# Patient Record
Sex: Female | Born: 1990
Health system: Southern US, Community
[De-identification: ages and names within clinical notes are randomized; demographics above are authoritative.]

## PROBLEM LIST (undated history)

## (undated) ENCOUNTER — Inpatient Hospital Stay (HOSPITAL_COMMUNITY): Payer: Self-pay

## (undated) DIAGNOSIS — L509 Urticaria, unspecified: Secondary | ICD-10-CM

## (undated) DIAGNOSIS — J45909 Unspecified asthma, uncomplicated: Secondary | ICD-10-CM

## (undated) HISTORY — PX: TONSILLECTOMY: SUR1361

## (undated) HISTORY — PX: WISDOM TOOTH EXTRACTION: SHX21

## (undated) HISTORY — PX: NO PAST SURGERIES: SHX2092

## (undated) HISTORY — DX: Urticaria, unspecified: L50.9

## (undated) HISTORY — PX: OTHER SURGICAL HISTORY: SHX169

---

## 2013-01-03 ENCOUNTER — Inpatient Hospital Stay (HOSPITAL_COMMUNITY)
Admission: AD | Admit: 2013-01-03 | Discharge: 2013-01-03 | Disposition: A | Discharge status: Home / Self Care | Payer: Self-pay | Source: Ambulatory Visit | Attending: Obstetrics & Gynecology | Admitting: Obstetrics & Gynecology

## 2013-01-03 ENCOUNTER — Encounter (HOSPITAL_COMMUNITY): Payer: Self-pay

## 2013-01-03 ENCOUNTER — Inpatient Hospital Stay (HOSPITAL_COMMUNITY): Payer: Self-pay

## 2013-01-03 DIAGNOSIS — R1032 Left lower quadrant pain: Secondary | ICD-10-CM | POA: Insufficient documentation

## 2013-01-03 DIAGNOSIS — Z1389 Encounter for screening for other disorder: Secondary | ICD-10-CM

## 2013-01-03 DIAGNOSIS — Z3490 Encounter for supervision of normal pregnancy, unspecified, unspecified trimester: Secondary | ICD-10-CM

## 2013-01-03 DIAGNOSIS — O99891 Other specified diseases and conditions complicating pregnancy: Secondary | ICD-10-CM | POA: Insufficient documentation

## 2013-01-03 LAB — URINALYSIS, ROUTINE W REFLEX MICROSCOPIC
Leukocytes, UA: NEGATIVE
Nitrite: NEGATIVE
Protein, ur: NEGATIVE mg/dL
Specific Gravity, Urine: 1.025 (ref 1.005–1.030)
Urobilinogen, UA: 0.2 mg/dL (ref 0.0–1.0)

## 2013-01-03 LAB — POCT PREGNANCY, URINE: Preg Test, Ur: POSITIVE — AB

## 2013-01-03 LAB — HCG, QUANTITATIVE, PREGNANCY: hCG, Beta Chain, Quant, S: 8937 m[IU]/mL — ABNORMAL HIGH (ref <5)

## 2013-01-03 NOTE — MAU Provider Note (Signed)
Chief Complaint: Abdominal Pain   First Provider Initiated Contact with Patient 01/03/13 1611     SUBJECTIVE HPI: Diamond Bolton is a 22 y.o. G1P0 at [redacted]w[redacted]d by LMP who presents to maternity admissions reporting abdominal cramping, mostly in LLQ and pelvic pressure.  She denies vaginal bleeding, vaginal itching/burning, urinary symptoms, h/a, dizziness, n/v, or fever/chills.     Past Medical History  Diagnosis Date  . Medical history non-contributory    Past Surgical History  Procedure Laterality Date  . No past surgeries     History   Social History  . Marital Status: Single    Spouse Name: N/A    Number of Children: N/A  . Years of Education: N/A   Occupational History  . Not on file.   Social History Main Topics  . Smoking status: Current Every Day Smoker  . Smokeless tobacco: Not on file  . Alcohol Use: Not on file  . Drug Use: Not on file  . Sexually Active: Yes   Other Topics Concern  . Not on file   Social History Narrative  . No narrative on file   No current facility-administered medications on file prior to encounter.   No current outpatient prescriptions on file prior to encounter.   No Known Allergies  ROS: Pertinent items in HPI  OBJECTIVE Blood pressure 96/68, temperature 98.7 F (37.1 C), temperature source Oral, resp. rate 16, last menstrual period 12/02/2012. GENERAL: Well-developed, well-nourished female in no acute distress.  HEENT: Normocephalic HEART: normal rate RESP: normal effort ABDOMEN: Soft, non-tender EXTREMITIES: Nontender, no edema NEURO: Alert and oriented Pelvic exam: Cervix pink, visually closed, without lesion, scant white creamy discharge, vaginal walls and external genitalia normal Bimanual exam: Cervix 0/long/high, firm, anterior, neg CMT, uterus nontender, slightly enlarged, adnexa without tenderness, enlargement, or mass  LAB RESULTS Results for orders placed during the hospital encounter of 01/03/13 (from the past 24  hour(s))  URINALYSIS, ROUTINE W REFLEX MICROSCOPIC     Status: None   Collection Time    01/03/13  2:05 PM      Result Value Range   Color, Urine YELLOW  YELLOW   APPearance CLEAR  CLEAR   Specific Gravity, Urine 1.025  1.005 - 1.030   pH 6.0  5.0 - 8.0   Glucose, UA NEGATIVE  NEGATIVE mg/dL   Hgb urine dipstick NEGATIVE  NEGATIVE   Bilirubin Urine NEGATIVE  NEGATIVE   Ketones, ur NEGATIVE  NEGATIVE mg/dL   Protein, ur NEGATIVE  NEGATIVE mg/dL   Urobilinogen, UA 0.2  0.0 - 1.0 mg/dL   Nitrite NEGATIVE  NEGATIVE   Leukocytes, UA NEGATIVE  NEGATIVE  POCT PREGNANCY, URINE     Status: Abnormal   Collection Time    01/03/13  2:25 PM      Result Value Range   Preg Test, Ur POSITIVE (*) NEGATIVE  WET PREP, GENITAL     Status: Abnormal   Collection Time    01/03/13  4:30 PM      Result Value Range   Yeast Wet Prep HPF POC NONE SEEN  NONE SEEN   Trich, Wet Prep NONE SEEN  NONE SEEN   Clue Cells Wet Prep HPF POC FEW (*) NONE SEEN   WBC, Wet Prep HPF POC MODERATE (*) NONE SEEN  HCG, QUANTITATIVE, PREGNANCY     Status: Abnormal   Collection Time    01/03/13  4:35 PM      Result Value Range   hCG, Beta Chain, Quant, S  1610 (*) <5 mIU/mL    IMAGING US Ob Comp Less 14 Wks  01/03/2013   *RADIOLOGY REPORT*  Clinical Data: Abdominal pain in early pregnancy.  OBSTETRIC <14 WK [place] AND TRANSVAGINAL OB US  Technique:  Both transabdominal and transvaginal ultrasound examinations were performed for complete evaluation of the gestation as well as the maternal uterus, adnexal regions, and pelvic cul-de-sac.  Transvaginal technique was performed to assess early pregnancy.  Comparison:  None.  Intrauterine gestational sac:  Single Yolk sac: Yes Embryo: Yes Cardiac Activity: Yes Heart Rate: 116 bpm  CRL: 2.8  mm  6 w  0 d        [place] EDC: 08/28/2013  Maternal uterus/adnexae: There is no subchorionic hemorrhage.  The ovaries appear normal. There is a trace of free fluid in the pelvis.  IMPRESSION: Normal  appearing single intrauterine pregnancy of approximately 6 weeks and 0 days gestation.   Original Report Authenticated By: Francene Boyers, M.D.   US Ob Transvaginal  01/03/2013   *RADIOLOGY REPORT*  Clinical Data: Abdominal pain in early pregnancy.  OBSTETRIC <14 WK [place] AND TRANSVAGINAL OB US  Technique:  Both transabdominal and transvaginal ultrasound examinations were performed for complete evaluation of the gestation as well as the maternal uterus, adnexal regions, and pelvic cul-de-sac.  Transvaginal technique was performed to assess early pregnancy.  Comparison:  None.  Intrauterine gestational sac:  Single Yolk sac: Yes Embryo: Yes Cardiac Activity: Yes Heart Rate: 116 bpm  CRL: 2.8  mm  6 w  0 d        [place] EDC: 08/28/2013  Maternal uterus/adnexae: There is no subchorionic hemorrhage.  The ovaries appear normal. There is a trace of free fluid in the pelvis.  IMPRESSION: Normal appearing single intrauterine pregnancy of approximately 6 weeks and 0 days gestation.   Original Report Authenticated By: Francene Boyers, M.D.    ASSESSMENT 1. Normal IUP (intrauterine pregnancy) on prenatal ultrasound     PLAN Discharge home F/U with early prenatal care Pregnancy verification letter given Return to MAU as needed    Medication List    ASK your doctor about these medications       albuterol 108 (90 BASE) MCG/ACT inhaler  Commonly known as:  PROVENTIL HFA;VENTOLIN HFA  Inhale 2 puffs into the lungs every 6 (six) hours as needed for wheezing.         Sharen Counter Certified Nurse-Midwife 01/03/2013  4:17 PM

## 2013-01-03 NOTE — MAU Note (Signed)
Patient is in with c/o abdominal pain and late on her period this month. lmp 12/02/12. She denies vaginal bleeding or Abnormal  Discharge.

## 2013-01-03 NOTE — MAU Note (Signed)
Pt states she has not had a menses since before 12/02/2012. Pt states she has been having abdominal pain for 3 wks. Pt states she not taken a HPT

## 2013-01-04 LAB — GC/CHLAMYDIA PROBE AMP: GC Probe RNA: NEGATIVE

## 2013-01-10 NOTE — MAU Provider Note (Signed)
Attestation of Attending Supervision of Advanced Practitioner (CNM/NP): Evaluation and management procedures were performed by the Advanced Practitioner under my supervision and collaboration. I have reviewed the Advanced Practitioner's note and chart, and I agree with the management and plan.  Dade Rodin H. 3:17 PM   

## 2013-01-25 ENCOUNTER — Encounter (HOSPITAL_COMMUNITY): Payer: Self-pay

## 2013-01-25 ENCOUNTER — Inpatient Hospital Stay (HOSPITAL_COMMUNITY)
Admission: AD | Admit: 2013-01-25 | Discharge: 2013-01-25 | Disposition: A | Discharge status: Home / Self Care | Payer: 59 | Source: Ambulatory Visit | Attending: Family Medicine | Admitting: Family Medicine

## 2013-01-25 DIAGNOSIS — O210 Mild hyperemesis gravidarum: Secondary | ICD-10-CM | POA: Insufficient documentation

## 2013-01-25 DIAGNOSIS — O219 Vomiting of pregnancy, unspecified: Secondary | ICD-10-CM

## 2013-01-25 LAB — URINALYSIS, ROUTINE W REFLEX MICROSCOPIC
Ketones, ur: NEGATIVE mg/dL
Leukocytes, UA: NEGATIVE
Nitrite: NEGATIVE
Specific Gravity, Urine: >1.03 — ABNORMAL HIGH (ref 1.005–1.030)
pH: 7 (ref 5.0–8.0)

## 2013-01-25 LAB — WET PREP, GENITAL: Yeast Wet Prep HPF POC: NONE SEEN

## 2013-01-25 MED ORDER — PROMETHAZINE HCL 25 MG RE SUPP: RECTAL | Status: DC

## 2013-01-25 MED ORDER — ONDANSETRON 8 MG PO TBDP
8.0000 mg | ORAL_TABLET | Freq: Once | ORAL | Status: AC
  Administered 2013-01-25: 8 mg via ORAL
  Filled 2013-01-25: qty 1

## 2013-01-25 NOTE — MAU Note (Signed)
Patient sates she has had nausea and vomiting for a while, unable to keep hardly anything down. Has mild cramping and has had brown spotting for the past 3 days.

## 2013-01-25 NOTE — MAU Provider Note (Signed)
Chart reviewed and agree with management and plan.  

## 2013-01-25 NOTE — MAU Provider Note (Signed)
History     CSN: [id number]  Arrival date and time: 01/25/13 1229   None     Chief Complaint  Patient presents with  . Emesis  . Abdominal Pain  . Vaginal Bleeding   HPI Diamond Bolton is 22 y.o. G1P0 [redacted]w[redacted]d weeks presenting with nausea and vomiting.  She reports vomiting 3-4 X day.  Unable to keep food or fluids down.  Was seen here 8/5 with U/S that showed a viable pregnancy.   Is a patient of [facility], will continue care with [facility].  She is not actively vomiting at this time. Denies abdominal pain.  States she had spotting last night and this morning, denies active bleeding.    Past Medical History  Diagnosis Date  . Medical history non-contributory     Past Surgical History  Procedure Laterality Date  . No past surgeries      Family History  Problem Relation Age of Onset  . Thyroid disease Mother   . Diabetes Sister   . Cancer Maternal Grandmother     History  Substance Use Topics  . Smoking status: Current Every Day Smoker  . Smokeless tobacco: Never Used  . Alcohol Use: Not on file    Allergies: No Known Allergies  Prescriptions prior to admission  Medication Sig Dispense Refill  . Prenatal Vit-Fe Fumarate-FA (PRENATAL MULTIVITAMIN) TABS tablet Take 1 tablet by mouth daily at 12 noon.      Marland Kitchen albuterol (PROVENTIL HFA;VENTOLIN HFA) 108 (90 BASE) MCG/ACT inhaler Inhale 2 puffs into the lungs every 6 (six) hours as needed for wheezing.        Review of Systems  Constitutional: Negative for fever and chills.  Gastrointestinal: Positive for nausea and vomiting. Negative for abdominal pain.  Genitourinary: Negative for dysuria, urgency and frequency.       + spotting  Neurological: Positive for headaches.   Physical Exam   Blood pressure 115/58, pulse 72, temperature 98.4 F (36.9 C), temperature source Oral, resp. rate 16, height 5' 5.5" (1.664 m), weight 202 lb (91.627 kg), last menstrual period 12/02/2012, SpO2 98.00%.  Physical Exam   Constitutional: She is oriented to person, place, and time. She appears well-developed and well-nourished. No distress.  HENT:  Head: Normocephalic.  Neck: Normal range of motion.  Cardiovascular: Normal rate.   Respiratory: Effort normal.  GI: Soft. She exhibits no distension and no mass. There is no tenderness. There is no rebound and no guarding.  Genitourinary: There is no rash, tenderness or lesion on the right labia. There is no rash, tenderness or lesion on the left labia. Uterus is enlarged (9 week size). Uterus is not tender. Cervix exhibits no motion tenderness, no discharge and no friability. Right adnexum displays no mass, no tenderness and no fullness. Left adnexum displays no mass, no tenderness and no fullness. No bleeding around the vagina. Vaginal discharge (small amount of white discharge) found.  Neurological: She is alert and oriented to person, place, and time.  Skin: Skin is warm and dry.  Psychiatric: She has a normal mood and affect. Her behavior is normal.   Results for orders placed during the hospital encounter of 01/25/13 (from the past 24 hour(s))  URINALYSIS, ROUTINE W REFLEX MICROSCOPIC     Status: Abnormal   Collection Time    01/25/13 12:37 PM      Result Value Range   Color, Urine YELLOW  YELLOW   APPearance CLEAR  CLEAR   Specific Gravity, Urine >1.030 (*) 1.005 -  1.030   pH 7.0  5.0 - 8.0   Glucose, UA NEGATIVE  NEGATIVE mg/dL   Hgb urine dipstick NEGATIVE  NEGATIVE   Bilirubin Urine NEGATIVE  NEGATIVE   Ketones, ur NEGATIVE  NEGATIVE mg/dL   Protein, ur NEGATIVE  NEGATIVE mg/dL   Urobilinogen, UA 0.2  0.0 - 1.0 mg/dL   Nitrite NEGATIVE  NEGATIVE   Leukocytes, UA NEGATIVE  NEGATIVE  WET PREP, GENITAL     Status: Abnormal   Collection Time    01/25/13  1:57 PM      Result Value Range   Yeast Wet Prep HPF POC NONE SEEN  NONE SEEN   Trich, Wet Prep NONE SEEN  NONE SEEN   Clue Cells Wet Prep HPF POC FEW (*) NONE SEEN   WBC, Wet Prep HPF POC FEW  (*) NONE SEEN   MAU Course  Procedures  Zofran 8mg  ODT given in MAU  MDM Patient has not vomited while in MAU.  Patient had Neg GC/CHL culture on 01/03/13 here.    Assessment and Plan  A:  Nausea and vomiting in first trimester pregnancy  P:  Rx for Phenergan Suppositories 1/2-1 supp q6-8 hrs prn      Call for appointment to being prenatal care.  KEY,EVE M 01/25/2013, 2:31 PM

## 2013-01-25 NOTE — Progress Notes (Signed)
Interview limited by patient talking on cell phone. Never ended call while attempting to update information.

## 2013-03-13 ENCOUNTER — Encounter (HOSPITAL_COMMUNITY): Payer: Self-pay | Admitting: *Deleted

## 2013-03-13 ENCOUNTER — Inpatient Hospital Stay (HOSPITAL_COMMUNITY)
Admission: AD | Admit: 2013-03-13 | Discharge: 2013-03-13 | Disposition: A | Discharge status: Home / Self Care | Payer: 59 | Source: Ambulatory Visit | Attending: Obstetrics and Gynecology | Admitting: Obstetrics and Gynecology

## 2013-03-13 DIAGNOSIS — R109 Unspecified abdominal pain: Secondary | ICD-10-CM | POA: Insufficient documentation

## 2013-03-13 DIAGNOSIS — O99891 Other specified diseases and conditions complicating pregnancy: Secondary | ICD-10-CM | POA: Insufficient documentation

## 2013-03-13 DIAGNOSIS — O26899 Other specified pregnancy related conditions, unspecified trimester: Secondary | ICD-10-CM

## 2013-03-13 HISTORY — DX: Unspecified asthma, uncomplicated: J45.909

## 2013-03-13 LAB — URINALYSIS, ROUTINE W REFLEX MICROSCOPIC
Bilirubin Urine: NEGATIVE
Hgb urine dipstick: NEGATIVE
Ketones, ur: NEGATIVE mg/dL
Specific Gravity, Urine: >1.03 — ABNORMAL HIGH (ref 1.005–1.030)
Urobilinogen, UA: 0.2 mg/dL (ref 0.0–1.0)

## 2013-03-13 LAB — WET PREP, GENITAL
Clue Cells Wet Prep HPF POC: NONE SEEN
Yeast Wet Prep HPF POC: NONE SEEN

## 2013-03-13 NOTE — MAU Provider Note (Signed)
History     CSN: [id number]  Arrival date and time: 03/13/13 1803   First Provider Initiated Contact with Patient 03/13/13 1842      Chief Complaint  Patient presents with  . Abdominal Pain   HPI Comments: Diamond Bolton 22 y.o.G1P0 presents to MAU for abdominal cramping and spotting x 1. She has been here in past and had an ultrasound showing IUP. She has not established care as she has not gotten her Medicaid.       Patient is a 22 y.o. female presenting with abdominal pain.  Abdominal Pain The primary symptoms of the illness include abdominal pain.      Past Medical History  Diagnosis Date  . Medical history non-contributory   . Asthma     Past Surgical History  Procedure Laterality Date  . No past surgeries    . Tonsillectomy    . Addenoidectomy    . Wisdom tooth extraction      Family History  Problem Relation Age of Onset  . Thyroid disease Mother   . Diabetes Sister   . Cancer Maternal Grandmother   . Hypertension Maternal Aunt   . Hypertension Maternal Uncle     History  Substance Use Topics  . Smoking status: Former Smoker -- 0.50 packs/day for 2 years    Types: Cigarettes    Quit date: 01/11/2013  . Smokeless tobacco: Never Used  . Alcohol Use: No     Comment: not while pregnant    Allergies: No Known Allergies  Prescriptions prior to admission  Medication Sig Dispense Refill  . Prenatal Vit-Fe Fumarate-FA (PRENATAL MULTIVITAMIN) TABS tablet Take 1 tablet by mouth daily at 12 noon.      . promethazine (PHENERGAN) 25 MG suppository Use 1/2  suppository q 6-8 hrs  12 each  0  . albuterol (PROVENTIL HFA;VENTOLIN HFA) 108 (90 BASE) MCG/ACT inhaler Inhale 2 puffs into the lungs every 6 (six) hours as needed for wheezing.        Review of Systems  Constitutional: Negative.   HENT: Negative.   Eyes: Negative.   Respiratory: Negative.   Cardiovascular: Negative.   Gastrointestinal: Positive for abdominal pain.  Genitourinary: Negative.    Musculoskeletal: Negative.   Skin: Negative.   Neurological: Negative.   Psychiatric/Behavioral: Negative.    Physical Exam   Blood pressure 114/63, pulse 82, weight 199 lb (90.266 kg), last menstrual period 12/02/2012.  Physical Exam  Constitutional: She is oriented to person, place, and time. She appears well-developed and well-nourished. No distress.  HENT:  Head: Normocephalic and atraumatic.  Eyes: Pupils are equal, round, and reactive to light.  Neck: Normal range of motion.  Cardiovascular: Normal rate, regular rhythm and normal heart sounds.   Respiratory: Effort normal and breath sounds normal.  GI: Soft. Bowel sounds are normal. She exhibits no distension and no mass. There is no tenderness. There is no rebound and no guarding.  Genitourinary: Vagina normal and uterus normal. No vaginal discharge found.  Musculoskeletal: Normal range of motion.  Neurological: She is alert and oriented to person, place, and time.  Skin: Skin is warm and dry.  Psychiatric: She has a normal mood and affect. Her behavior is normal. Judgment and thought content normal.   Doppler 150 FHT Results for orders placed during the hospital encounter of 03/13/13 (from the past 24 hour(s))  URINALYSIS, ROUTINE W REFLEX MICROSCOPIC     Status: Abnormal   Collection Time    03/13/13  6:05 PM  Result Value Range   Color, Urine AMBER (*) YELLOW   APPearance CLOUDY (*) CLEAR   Specific Gravity, Urine >1.030 (*) 1.005 - 1.030   pH 6.0  5.0 - 8.0   Glucose, UA NEGATIVE  NEGATIVE mg/dL   Hgb urine dipstick NEGATIVE  NEGATIVE   Bilirubin Urine NEGATIVE  NEGATIVE   Ketones, ur NEGATIVE  NEGATIVE mg/dL   Protein, ur NEGATIVE  NEGATIVE mg/dL   Urobilinogen, UA 0.2  0.0 - 1.0 mg/dL   Nitrite NEGATIVE  NEGATIVE   Leukocytes, UA NEGATIVE  NEGATIVE  WET PREP, GENITAL     Status: Abnormal   Collection Time    03/13/13  7:05 PM      Result Value Range   Yeast Wet Prep HPF POC NONE SEEN  NONE SEEN    Trich, Wet Prep NONE SEEN  NONE SEEN   Clue Cells Wet Prep HPF POC NONE SEEN  NONE SEEN   WBC, Wet Prep HPF POC FEW (*) NONE SEEN    MAU Course  Procedures  MDM UA, wet prep/ GC/ Chlamydia   Assessment and Plan   A: Abdominal pain in second trimester  P: Reassurance with FHT Advised to hydrate well and start prenatal care asap   Diamond Bolton 03/13/2013, 7:16 PM

## 2013-03-13 NOTE — MAU Note (Signed)
Just started a new job, on her feet a lot. Spotting noted on the 7th.  Has been having a lot of cramping and pain on sides.

## 2013-03-14 LAB — GC/CHLAMYDIA PROBE AMP: GC Probe RNA: NEGATIVE

## 2013-03-14 NOTE — MAU Provider Note (Signed)
Attestation of Attending Supervision of Advanced Practitioner (CNM/NP): Evaluation and management procedures were performed by the Advanced Practitioner under my supervision and collaboration.  I have reviewed the Advanced Practitioner's note and chart, and I agree with the management and plan.  Jett Fukuda 03/14/2013 7:00 AM

## 2013-04-03 ENCOUNTER — Other Ambulatory Visit (HOSPITAL_COMMUNITY): Payer: Self-pay | Admitting: Nurse Practitioner

## 2013-04-03 DIAGNOSIS — Z3689 Encounter for other specified antenatal screening: Secondary | ICD-10-CM

## 2013-04-03 LAB — OB RESULTS CONSOLE HIV ANTIBODY (ROUTINE TESTING): HIV: NONREACTIVE

## 2013-04-03 LAB — OB RESULTS CONSOLE RUBELLA ANTIBODY, IGM: RUBELLA: IMMUNE

## 2013-04-03 LAB — OB RESULTS CONSOLE RPR: RPR: NONREACTIVE

## 2013-04-03 LAB — OB RESULTS CONSOLE GC/CHLAMYDIA
Chlamydia: NEGATIVE
Gonorrhea: NEGATIVE

## 2013-04-03 LAB — OB RESULTS CONSOLE ABO/RH: RH Type: POSITIVE

## 2013-04-03 LAB — OB RESULTS CONSOLE HEPATITIS B SURFACE ANTIGEN: Hepatitis B Surface Ag: NEGATIVE

## 2013-04-03 LAB — OB RESULTS CONSOLE ANTIBODY SCREEN: Antibody Screen: NEGATIVE

## 2013-04-06 ENCOUNTER — Ambulatory Visit (HOSPITAL_COMMUNITY)
Admission: RE | Admit: 2013-04-06 | Discharge: 2013-04-06 | Disposition: A | Discharge status: Home / Self Care | Payer: 59 | Source: Ambulatory Visit | Attending: Nurse Practitioner | Admitting: Nurse Practitioner

## 2013-04-13 ENCOUNTER — Other Ambulatory Visit (HOSPITAL_COMMUNITY): Payer: Self-pay | Admitting: Nurse Practitioner

## 2013-04-13 ENCOUNTER — Ambulatory Visit (HOSPITAL_COMMUNITY)
Admission: RE | Admit: 2013-04-13 | Discharge: 2013-04-13 | Disposition: A | Discharge status: Home / Self Care | Payer: 59 | Source: Ambulatory Visit | Attending: Nurse Practitioner | Admitting: Nurse Practitioner

## 2013-04-13 DIAGNOSIS — Z3689 Encounter for other specified antenatal screening: Secondary | ICD-10-CM

## 2013-07-10 ENCOUNTER — Inpatient Hospital Stay (HOSPITAL_COMMUNITY)
Admission: AD | Admit: 2013-07-10 | Discharge: 2013-07-10 | Disposition: A | Discharge status: Home / Self Care | Payer: 59 | Source: Ambulatory Visit | Attending: Obstetrics and Gynecology | Admitting: Obstetrics and Gynecology

## 2013-07-10 ENCOUNTER — Encounter (HOSPITAL_COMMUNITY): Payer: Self-pay | Admitting: *Deleted

## 2013-07-10 DIAGNOSIS — K5900 Constipation, unspecified: Secondary | ICD-10-CM

## 2013-07-10 DIAGNOSIS — O368190 Decreased fetal movements, unspecified trimester, not applicable or unspecified: Secondary | ICD-10-CM | POA: Insufficient documentation

## 2013-07-10 DIAGNOSIS — Z87891 Personal history of nicotine dependence: Secondary | ICD-10-CM | POA: Insufficient documentation

## 2013-07-10 DIAGNOSIS — R1012 Left upper quadrant pain: Secondary | ICD-10-CM | POA: Insufficient documentation

## 2013-07-10 DIAGNOSIS — R1032 Left lower quadrant pain: Secondary | ICD-10-CM | POA: Insufficient documentation

## 2013-07-10 LAB — WET PREP, GENITAL
Clue Cells Wet Prep HPF POC: NONE SEEN
Trich, Wet Prep: NONE SEEN
YEAST WET PREP: NONE SEEN

## 2013-07-10 LAB — URINE MICROSCOPIC-ADD ON

## 2013-07-10 LAB — URINALYSIS, ROUTINE W REFLEX MICROSCOPIC
Bilirubin Urine: NEGATIVE
GLUCOSE, UA: NEGATIVE mg/dL
HGB URINE DIPSTICK: NEGATIVE
Ketones, ur: NEGATIVE mg/dL
Nitrite: NEGATIVE
PH: 7 (ref 5.0–8.0)
PROTEIN: NEGATIVE mg/dL
Specific Gravity, Urine: 1.025 (ref 1.005–1.030)
Urobilinogen, UA: 1 mg/dL (ref 0.0–1.0)

## 2013-07-10 MED ORDER — POLYETHYLENE GLYCOL 3350 17 G PO PACK: 17.0000 g | PACK | Freq: Every day | ORAL | Status: DC

## 2013-07-10 MED ORDER — POLYETHYLENE GLYCOL 3350 17 G PO PACK
17.0000 g | PACK | Freq: Once | ORAL | Status: AC
  Administered 2013-07-10: 17 g via ORAL
  Filled 2013-07-10: qty 1

## 2013-07-10 NOTE — MAU Note (Signed)
Pt C/O decreased FM since last night.  LLQ & upper abd pain going on for "a while."  Denies bleeding or LOF.

## 2013-07-10 NOTE — MAU Provider Note (Signed)
Attestation of Attending Supervision of Advanced Practitioner (CNM/NP): Evaluation and management procedures were performed by the Advanced Practitioner under my supervision and collaboration.  I have reviewed the Advanced Practitioner's note and chart, and I agree with the management and plan.  Andreea Arca 07/10/2013 5:57 PM

## 2013-07-10 NOTE — Discharge Instructions (Signed)

## 2013-07-10 NOTE — MAU Provider Note (Signed)
  History     CSN: [id number]  Arrival date and time: 07/10/13 1309   None     Chief Complaint  Patient presents with  . Decreased Fetal Movement  . Abdominal Pain   HPI THis is a 23 y.o. female at 751w6d who presents with c/o decreased movement since last night. Also hx intermittent pain in LUQ and LLQ.  Has had this for a while. Pains are sharp, sudden and move around. + history of constipation. Denies fever, nausea or vomiting.   RN Note:  Pt C/O decreased FM since last night. LLQ & upper abd pain going on for "a while." Denies bleeding or LOF.       OB History   Grav Para Term Preterm Abortions TAB SAB Ect Mult Living   1               Past Medical History  Diagnosis Date  . Medical history non-contributory   . Asthma     Past Surgical History  Procedure Laterality Date  . No past surgeries    . Tonsillectomy    . Addenoidectomy    . Wisdom tooth extraction      Family History  Problem Relation Age of Onset  . Thyroid disease Mother   . Diabetes Sister   . Cancer Maternal Grandmother   . Hypertension Maternal Aunt   . Hypertension Maternal Uncle     History  Substance Use Topics  . Smoking status: Former Smoker -- 0.50 packs/day for 2 years    Types: Cigarettes    Quit date: 01/11/2013  . Smokeless tobacco: Never Used  . Alcohol Use: No     Comment: not while pregnant    Allergies: No Known Allergies  Prescriptions prior to admission  Medication Sig Dispense Refill  . Prenatal Vit-Fe Fumarate-FA (PRENATAL MULTIVITAMIN) TABS tablet Take 1 tablet by mouth daily at 12 noon.        Review of Systems  Constitutional: Negative for fever, chills and malaise/fatigue.  Gastrointestinal: Positive for heartburn, abdominal pain and constipation. Negative for nausea, vomiting and diarrhea.  Genitourinary: Negative for dysuria.  Neurological: Negative for dizziness and headaches.   Physical Exam   Blood pressure 100/47, pulse 89, temperature 97.9 F  (36.6 C), temperature source Oral, resp. rate 18, last menstrual period 12/02/2012.  Physical Exam  Constitutional: She is oriented to person, place, and time. She appears well-developed and well-nourished. No distress.  HENT:  Head: Normocephalic.  Cardiovascular: Normal rate.   Respiratory: Effort normal.  GI: Soft. She exhibits no distension. There is no tenderness. There is no rebound and no guarding.  Musculoskeletal: Normal range of motion.  Neurological: She is alert and oriented to person, place, and time.  Skin: Skin is warm and dry.  Psychiatric: She has a normal mood and affect.  Fetal heart rate reassuring No contractions   MAU Course  Procedures   Assessment and Plan  A:  SIUP at 4251w6d       Abdominal pain, diffuse, probably related to constipation  P:  Miralax given       Rx Miralax for home use       PTL precautions      Advised to come back if worsens or other symptoms appear.        Follow up in clinic        Children'S Rehabilitation CenterWILLIAMS,Shaney Deckman 07/10/2013, 3:03 PM

## 2013-07-13 LAB — URINE CULTURE

## 2013-08-03 LAB — OB RESULTS CONSOLE GBS: GBS: POSITIVE

## 2013-08-07 ENCOUNTER — Other Ambulatory Visit (HOSPITAL_COMMUNITY): Payer: Self-pay | Admitting: Nurse Practitioner

## 2013-08-07 DIAGNOSIS — O329XX0 Maternal care for malpresentation of fetus, unspecified, not applicable or unspecified: Secondary | ICD-10-CM

## 2013-08-10 ENCOUNTER — Ambulatory Visit (HOSPITAL_COMMUNITY)
Admission: RE | Admit: 2013-08-10 | Discharge: 2013-08-10 | Disposition: A | Discharge status: Home / Self Care | Payer: 59 | Source: Ambulatory Visit | Attending: Nurse Practitioner | Admitting: Nurse Practitioner

## 2013-08-10 DIAGNOSIS — O329XX0 Maternal care for malpresentation of fetus, unspecified, not applicable or unspecified: Secondary | ICD-10-CM

## 2013-08-10 DIAGNOSIS — Z1389 Encounter for screening for other disorder: Secondary | ICD-10-CM | POA: Insufficient documentation

## 2013-08-10 DIAGNOSIS — Z363 Encounter for antenatal screening for malformations: Secondary | ICD-10-CM | POA: Insufficient documentation

## 2013-08-28 ENCOUNTER — Other Ambulatory Visit (HOSPITAL_COMMUNITY): Payer: Self-pay | Admitting: Nurse Practitioner

## 2013-08-28 DIAGNOSIS — O480 Post-term pregnancy: Secondary | ICD-10-CM

## 2013-08-31 ENCOUNTER — Encounter (HOSPITAL_COMMUNITY): Payer: Self-pay | Admitting: *Deleted

## 2013-08-31 ENCOUNTER — Telehealth (HOSPITAL_COMMUNITY): Payer: Self-pay | Admitting: *Deleted

## 2013-08-31 NOTE — Telephone Encounter (Signed)
Preadmission screen  

## 2013-09-01 ENCOUNTER — Ambulatory Visit (HOSPITAL_COMMUNITY)
Admission: RE | Admit: 2013-09-01 | Discharge: 2013-09-01 | Disposition: A | Discharge status: Home / Self Care | Payer: 59 | Source: Ambulatory Visit | Attending: Nurse Practitioner | Admitting: Nurse Practitioner

## 2013-09-01 ENCOUNTER — Inpatient Hospital Stay (HOSPITAL_COMMUNITY)
Admission: AD | Admit: 2013-09-01 | Discharge: 2013-09-01 | Disposition: A | Discharge status: Home / Self Care | Payer: 59 | Source: Ambulatory Visit | Attending: Obstetrics and Gynecology | Admitting: Obstetrics and Gynecology

## 2013-09-01 ENCOUNTER — Encounter (HOSPITAL_COMMUNITY): Payer: Self-pay | Admitting: *Deleted

## 2013-09-01 DIAGNOSIS — O479 False labor, unspecified: Secondary | ICD-10-CM | POA: Diagnosis not present

## 2013-09-01 DIAGNOSIS — Z87891 Personal history of nicotine dependence: Secondary | ICD-10-CM | POA: Insufficient documentation

## 2013-09-01 DIAGNOSIS — O480 Post-term pregnancy: Secondary | ICD-10-CM

## 2013-09-01 DIAGNOSIS — O368390 Maternal care for abnormalities of the fetal heart rate or rhythm, unspecified trimester, not applicable or unspecified: Secondary | ICD-10-CM | POA: Insufficient documentation

## 2013-09-01 NOTE — MAU Provider Note (Signed)
History     CSN: [id number]  Arrival date and time: 09/01/13 1659   None     Chief Complaint  Patient presents with  . Non-stress Test   HPI Diamond Bolton is a 23 y.o. G1P0, 8640w3d sent to the MAU for a BPP 6/8 for breathing. She was sent to the MAU for further evaluation and to obtain an NST.    OB History   Grav Para Term Preterm Abortions TAB SAB Ect Mult Living   1               Past Medical History  Diagnosis Date  . Medical history non-contributory   . Asthma     Past Surgical History  Procedure Laterality Date  . No past surgeries    . Tonsillectomy    . Addenoidectomy    . Wisdom tooth extraction      Family History  Problem Relation Age of Onset  . Thyroid disease Mother   . Varicose Veins Mother   . Diabetes Sister   . Varicose Veins Sister   . Cancer Maternal Grandmother     breast  . Hypertension Maternal Aunt   . Hypertension Maternal Uncle   . Cancer Maternal Grandfather     lung and brain    History  Substance Use Topics  . Smoking status: Former Smoker -- 0.50 packs/day for 2 years    Types: Cigarettes    Quit date: 01/11/2013  . Smokeless tobacco: Never Used  . Alcohol Use: No     Comment: not while pregnant    Allergies: No Known Allergies  Prescriptions prior to admission  Medication Sig Dispense Refill  . Prenatal Vit-Fe Fumarate-FA (PRENATAL MULTIVITAMIN) TABS tablet Take 1 tablet by mouth daily at 12 noon.        Review of Systems  Constitutional: Negative for fever and chills.  HENT: Negative for sore throat.   Eyes: Negative for blurred vision.  Respiratory: Negative for cough, shortness of breath and stridor.   Cardiovascular: Negative for chest pain and leg swelling.  Gastrointestinal: Negative for nausea, vomiting, diarrhea and constipation.  Genitourinary: Negative.   Musculoskeletal: Negative.   Neurological: Negative.  Negative for headaches.    Physical Exam   Blood pressure 112/66, pulse 94, temperature 98  F (36.7 C), temperature source Oral, resp. rate 16, height 5\' 5"  (1.651 m), weight 103.329 kg (227 lb 12.8 oz), last menstrual period 12/02/2012.  Physical Exam  Constitutional: She is oriented to person, place, and time. She appears well-developed and well-nourished. No distress.  HENT:  Head: Normocephalic.  Eyes: EOM are normal. Pupils are equal, round, and reactive to light.  Neck: Neck supple. No tracheal deviation present.  Cardiovascular: Normal rate, regular rhythm, normal heart sounds and intact distal pulses.  Exam reveals no gallop and no friction rub.   No murmur heard. Respiratory: Effort normal and breath sounds normal. No stridor. No respiratory distress.  GI: Soft. Bowel sounds are normal. She exhibits no distension. There is no tenderness.  Musculoskeletal: She exhibits no edema and no tenderness.  Neurological: She is alert and oriented to person, place, and time.  Skin: Skin is warm and dry. No rash noted. No erythema.  Psychiatric: She has a normal mood and affect. Her behavior is normal.   No results found for this or any previous visit (from the past 24 hour(s)). MAU Course  Procedures  MDM Patient is scheduled for f/u in clinic on Tuesday 09/05/2013.  Assessment and Plan  Diamond Bolton is a 23 y.o. G1P0, [redacted]w[redacted]d sent to the MAU for a BPP w/ NST 8/10. -2 for breathing.  Patient had a reactive NST, is not feeling contractions, has had no vaginal bleeding or leakage. FHT were 140bpm. Plan is to d/c her home and f/u is scheduled in the clinic for 09/05/2013.   Wallis Bamberg 09/01/2013, 6:22 PM   I spoke with and examined patient and agree with PA-S's note and plan of care.  Diamond Scale, MD Ob Fellow 09/01/2013 6:32 PM

## 2013-09-01 NOTE — Discharge Instructions (Signed)
Third Trimester of Pregnancy  The third trimester is from week 29 through week 42, months 7 through 9. The third trimester is a time when the fetus is growing rapidly. At the end of the ninth month, the fetus is about 20 inches in length and weighs 6 10 pounds.   BODY CHANGES  Your body goes through many changes during pregnancy. The changes vary from woman to woman.    Your weight will continue to increase. You can expect to gain 25 35 pounds (11 16 kg) by the end of the pregnancy.   You may begin to get stretch marks on your hips, abdomen, and breasts.   You may urinate more often because the fetus is moving lower into your pelvis and pressing on your bladder.   You may develop or continue to have heartburn as a result of your pregnancy.   You may develop constipation because certain hormones are causing the muscles that push waste through your intestines to slow down.   You may develop hemorrhoids or swollen, bulging veins (varicose veins).   You may have pelvic pain because of the weight gain and pregnancy hormones relaxing your joints between the bones in your pelvis. Back aches may result from over exertion of the muscles supporting your posture.   Your breasts will continue to grow and be tender. A yellow discharge may leak from your breasts called colostrum.   Your belly button may stick out.   You may feel short of breath because of your expanding uterus.   You may notice the fetus "dropping," or moving lower in your abdomen.   You may have a bloody mucus discharge. This usually occurs a few days to a week before labor begins.   Your cervix becomes thin and soft (effaced) near your due date.  WHAT TO EXPECT AT YOUR PRENATAL EXAMS   You will have prenatal exams every 2 weeks until week 36. Then, you will have weekly prenatal exams. During a routine prenatal visit:   You will be weighed to make sure you and the fetus are growing normally.   Your blood pressure is taken.   Your abdomen will be  measured to track your baby's growth.   The fetal heartbeat will be listened to.   Any test results from the previous visit will be discussed.   You may have a cervical check near your due date to see if you have effaced.  At around 36 weeks, your caregiver will check your cervix. At the same time, your caregiver will also perform a test on the secretions of the vaginal tissue. This test is to determine if a type of bacteria, Group B streptococcus, is present. Your caregiver will explain this further.  Your caregiver may ask you:   What your birth plan is.   How you are feeling.   If you are feeling the baby move.   If you have had any abnormal symptoms, such as leaking fluid, bleeding, severe headaches, or abdominal cramping.   If you have any questions.  Other tests or screenings that may be performed during your third trimester include:   Blood tests that check for low iron levels (anemia).   Fetal testing to check the health, activity level, and growth of the fetus. Testing is done if you have certain medical conditions or if there are problems during the pregnancy.  FALSE LABOR  You may feel small, irregular contractions that eventually go away. These are called Braxton Hicks contractions, or   false labor. Contractions may last for hours, days, or even weeks before true labor sets in. If contractions come at regular intervals, intensify, or become painful, it is best to be seen by your caregiver.   SIGNS OF LABOR    Menstrual-like cramps.   Contractions that are 5 minutes apart or less.   Contractions that start on the top of the uterus and spread down to the lower abdomen and back.   A sense of increased pelvic pressure or back pain.   A watery or bloody mucus discharge that comes from the vagina.  If you have any of these signs before the 37th week of pregnancy, call your caregiver right away. You need to go to the hospital to get checked immediately.  HOME CARE INSTRUCTIONS    Avoid all  smoking, herbs, alcohol, and unprescribed drugs. These chemicals affect the formation and growth of the baby.   Follow your caregiver's instructions regarding medicine use. There are medicines that are either safe or unsafe to take during pregnancy.   Exercise only as directed by your caregiver. Experiencing uterine cramps is a good sign to stop exercising.   Continue to eat regular, healthy meals.   Wear a good support bra for breast tenderness.   Do not use hot tubs, steam rooms, or saunas.   Wear your seat belt at all times when driving.   Avoid raw meat, uncooked cheese, cat litter boxes, and soil used by cats. These carry germs that can cause birth defects in the baby.   Take your prenatal vitamins.   Try taking a stool softener (if your caregiver approves) if you develop constipation. Eat more high-fiber foods, such as fresh vegetables or fruit and whole grains. Drink plenty of fluids to keep your urine clear or pale yellow.   Take warm sitz baths to soothe any pain or discomfort caused by hemorrhoids. Use hemorrhoid cream if your caregiver approves.   If you develop varicose veins, wear support hose. Elevate your feet for 15 minutes, 3 4 times a day. Limit salt in your diet.   Avoid heavy lifting, wear low heal shoes, and practice good posture.   Rest a lot with your legs elevated if you have leg cramps or low back pain.   Visit your dentist if you have not gone during your pregnancy. Use a soft toothbrush to brush your teeth and be gentle when you floss.   A sexual relationship may be continued unless your caregiver directs you otherwise.   Do not travel far distances unless it is absolutely necessary and only with the approval of your caregiver.   Take prenatal classes to understand, practice, and ask questions about the labor and delivery.   Make a trial run to the hospital.   Pack your hospital bag.   Prepare the baby's nursery.   Continue to go to all your prenatal visits as directed  by your caregiver.  SEEK MEDICAL CARE IF:   You are unsure if you are in labor or if your water has broken.   You have dizziness.   You have mild pelvic cramps, pelvic pressure, or nagging pain in your abdominal area.   You have persistent nausea, vomiting, or diarrhea.   You have a bad smelling vaginal discharge.   You have pain with urination.  SEEK IMMEDIATE MEDICAL CARE IF:    You have a fever.   You are leaking fluid from your vagina.   You have spotting or bleeding from your vagina.     You have severe abdominal cramping or pain.   You have rapid weight loss or gain.   You have shortness of breath with chest pain.   You notice sudden or extreme swelling of your face, hands, ankles, feet, or legs.   You have not felt your baby move in over an hour.   You have severe headaches that do not go away with medicine.   You have vision changes.  Document Released: 05/12/2001 Document Revised: 01/18/2013 Document Reviewed: 07/19/2012  [employer] Patient Information 2014 [employer].

## 2013-09-01 NOTE — MAU Note (Signed)
Patient states she was in ultrasound for a BPP that was 6/8. Sent to MAU for further evaluation. Patient reports mild irregular contractions. Denies bleeding or leaking.

## 2013-09-04 ENCOUNTER — Ambulatory Visit (HOSPITAL_COMMUNITY)
Admission: RE | Admit: 2013-09-04 | Discharge: 2013-09-04 | Disposition: A | Discharge status: Home / Self Care | Payer: 59 | Source: Ambulatory Visit | Attending: Obstetrics & Gynecology | Admitting: Obstetrics & Gynecology

## 2013-09-04 ENCOUNTER — Other Ambulatory Visit: Payer: Self-pay | Admitting: Obstetrics & Gynecology

## 2013-09-04 DIAGNOSIS — O480 Post-term pregnancy: Secondary | ICD-10-CM | POA: Insufficient documentation

## 2013-09-05 NOTE — MAU Provider Note (Signed)
Attestation of Attending Supervision of Advanced Practitioner: Evaluation and management procedures were performed by the PA/NP/CNM/OB Fellow under my supervision/collaboration. Chart reviewed and agree with management and plan.  Konnor Jorden V 09/05/2013 11:02 PM     Attestation of Attending Supervision of Advanced Practitioner: Evaluation and management procedures were performed by the PA/NP/CNM/OB Fellow under my supervision/collaboration. Chart reviewed and agree with management and plan.  Becki Mccaskill V 09/05/2013 11:02 PM

## 2013-09-07 ENCOUNTER — Inpatient Hospital Stay (HOSPITAL_COMMUNITY)
Admission: RE | Admit: 2013-09-07 | Discharge: 2013-09-11 | DRG: 766 | Disposition: A | Discharge status: Home / Self Care | Payer: 59 | Source: Ambulatory Visit | Attending: Family Medicine | Admitting: Family Medicine

## 2013-09-07 ENCOUNTER — Encounter (HOSPITAL_COMMUNITY): Payer: Self-pay

## 2013-09-07 DIAGNOSIS — Z22330 Carrier of Group B streptococcus: Secondary | ICD-10-CM | POA: Diagnosis not present

## 2013-09-07 DIAGNOSIS — F1210 Cannabis abuse, uncomplicated: Secondary | ICD-10-CM | POA: Diagnosis present

## 2013-09-07 DIAGNOSIS — O99344 Other mental disorders complicating childbirth: Secondary | ICD-10-CM | POA: Diagnosis present

## 2013-09-07 DIAGNOSIS — Z8759 Personal history of other complications of pregnancy, childbirth and the puerperium: Secondary | ICD-10-CM

## 2013-09-07 DIAGNOSIS — O99214 Obesity complicating childbirth: Secondary | ICD-10-CM

## 2013-09-07 DIAGNOSIS — O480 Post-term pregnancy: Secondary | ICD-10-CM | POA: Diagnosis present

## 2013-09-07 DIAGNOSIS — E669 Obesity, unspecified: Secondary | ICD-10-CM | POA: Diagnosis present

## 2013-09-07 DIAGNOSIS — O99892 Other specified diseases and conditions complicating childbirth: Secondary | ICD-10-CM | POA: Diagnosis present

## 2013-09-07 DIAGNOSIS — Z87891 Personal history of nicotine dependence: Secondary | ICD-10-CM | POA: Diagnosis not present

## 2013-09-07 DIAGNOSIS — J45909 Unspecified asthma, uncomplicated: Secondary | ICD-10-CM | POA: Diagnosis present

## 2013-09-07 DIAGNOSIS — Z68.41 Body mass index (BMI) pediatric, 5th percentile to less than 85th percentile for age: Secondary | ICD-10-CM | POA: Diagnosis not present

## 2013-09-07 DIAGNOSIS — O9989 Other specified diseases and conditions complicating pregnancy, childbirth and the puerperium: Secondary | ICD-10-CM

## 2013-09-07 DIAGNOSIS — Z98891 History of uterine scar from previous surgery: Secondary | ICD-10-CM

## 2013-09-07 LAB — CBC
HEMATOCRIT: 36.4 % (ref 36.0–46.0)
Hemoglobin: 12.6 g/dL (ref 12.0–15.0)
MCH: 29.9 pg (ref 26.0–34.0)
MCHC: 34.6 g/dL (ref 30.0–36.0)
MCV: 86.3 fL (ref 78.0–100.0)
Platelets: 276 10*3/uL (ref 150–400)
RBC: 4.22 MIL/uL (ref 3.87–5.11)
RDW: 13.5 % (ref 11.5–15.5)
WBC: 8.3 10*3/uL (ref 4.0–10.5)

## 2013-09-07 MED ORDER — OXYTOCIN BOLUS FROM INFUSION: 500.0000 mL | INTRAVENOUS | Status: DC

## 2013-09-07 MED ORDER — CITRIC ACID-SODIUM CITRATE 334-500 MG/5ML PO SOLN
30.0000 mL | ORAL | Status: DC | PRN
  Administered 2013-09-09: 30 mL via ORAL
  Filled 2013-09-07: qty 15

## 2013-09-07 MED ORDER — OXYCODONE-ACETAMINOPHEN 5-325 MG PO TABS: 1.0000 | ORAL_TABLET | ORAL | Status: DC | PRN

## 2013-09-07 MED ORDER — FENTANYL CITRATE 0.05 MG/ML IJ SOLN
50.0000 ug | INTRAMUSCULAR | Status: DC | PRN
  Administered 2013-09-08: 50 ug via INTRAVENOUS
  Filled 2013-09-07: qty 2

## 2013-09-07 MED ORDER — ACETAMINOPHEN 325 MG PO TABS: 650.0000 mg | ORAL_TABLET | ORAL | Status: DC | PRN

## 2013-09-07 MED ORDER — ZOLPIDEM TARTRATE 5 MG PO TABS
5.0000 mg | ORAL_TABLET | Freq: Every evening | ORAL | Status: DC | PRN
  Administered 2013-09-08: 5 mg via ORAL
  Filled 2013-09-07: qty 1

## 2013-09-07 MED ORDER — LACTATED RINGERS IV SOLN
500.0000 mL | INTRAVENOUS | Status: DC | PRN
  Administered 2013-09-08: 500 mL via INTRAVENOUS

## 2013-09-07 MED ORDER — PENICILLIN G POTASSIUM 5000000 UNITS IJ SOLR
2.5000 10*6.[IU] | INTRAVENOUS | Status: DC
  Administered 2013-09-08 (×4): 2.5 10*6.[IU] via INTRAVENOUS
  Filled 2013-09-07 (×8): qty 2.5

## 2013-09-07 MED ORDER — LACTATED RINGERS IV SOLN
INTRAVENOUS | Status: DC
  Administered 2013-09-07 – 2013-09-09 (×6): via INTRAVENOUS

## 2013-09-07 MED ORDER — LIDOCAINE HCL (PF) 1 % IJ SOLN: 30.0000 mL | INTRAMUSCULAR | Status: DC | PRN

## 2013-09-07 MED ORDER — MISOPROSTOL 25 MCG QUARTER TABLET
25.0000 ug | ORAL_TABLET | ORAL | Status: DC | PRN
  Administered 2013-09-07: 25 ug via VAGINAL
  Filled 2013-09-07: qty 0.25

## 2013-09-07 MED ORDER — PENICILLIN G POTASSIUM 5000000 UNITS IJ SOLR
5.0000 10*6.[IU] | Freq: Once | INTRAVENOUS | Status: AC
  Administered 2013-09-08: 5 10*6.[IU] via INTRAVENOUS
  Filled 2013-09-07: qty 5

## 2013-09-07 MED ORDER — OXYTOCIN 40 UNITS IN LACTATED RINGERS INFUSION - SIMPLE MED: 62.5000 mL/h | INTRAVENOUS | Status: DC

## 2013-09-07 MED ORDER — ONDANSETRON HCL 4 MG/2ML IJ SOLN
4.0000 mg | Freq: Four times a day (QID) | INTRAMUSCULAR | Status: DC | PRN
  Administered 2013-09-08: 4 mg via INTRAVENOUS
  Filled 2013-09-07: qty 2

## 2013-09-07 MED ORDER — FLEET ENEMA 7-19 GM/118ML RE ENEM: 1.0000 | ENEMA | RECTAL | Status: DC | PRN

## 2013-09-07 MED ORDER — TERBUTALINE SULFATE 1 MG/ML IJ SOLN: 0.2500 mg | Freq: Once | INTRAMUSCULAR | Status: AC | PRN

## 2013-09-07 MED ORDER — IBUPROFEN 600 MG PO TABS: 600.0000 mg | ORAL_TABLET | Freq: Four times a day (QID) | ORAL | Status: DC | PRN

## 2013-09-07 NOTE — H&P (Signed)
Diamond Bolton is a 23 y.o. female, G1P0, [redacted]w[redacted]d presenting for IOL for postdates. Patient denies leakage of fluid, vaginal bleeding and is having irregular contractions.   Maternal Medical History:  Reason for admission: Nausea.    OB History   Grav Para Term Preterm Abortions TAB SAB Ect Mult Living   1              Past Medical History  Diagnosis Date  . Medical history non-contributory   . Asthma    Past Surgical History  Procedure Laterality Date  . No past surgeries    . Tonsillectomy    . Addenoidectomy    . Wisdom tooth extraction     Family History: family history includes Cancer in her maternal grandfather and maternal grandmother; Diabetes in her sister; Hypertension in her maternal aunt and maternal uncle; Thyroid disease in her mother; Varicose Veins in her mother and sister. Ovarian cancer in maternal GM, passed away at 1; lung cancer in maternal GF passed at 39, Mother has hyperthyroidism Social History:  reports that she quit smoking about 7 months ago. Her smoking use included Cigarettes. She has a 1 pack-year smoking history. She has never used smokeless tobacco. She reports that she uses illicit drugs (Marijuana). She reports that she does not drink alcohol.   Prenatal Transfer Tool  Maternal Diabetes: No Genetic Screening: Normal Maternal Ultrasounds/Referrals: Normal Fetal Ultrasounds or other Referrals:  None Maternal Substance Abuse:  Yes:  Type: Smoker, Marijuana, Other: admits smoking and marijuana use early in pregnancy but stopped use once she learned she was pregnant Significant Maternal Medications:  None Significant Maternal Lab Results:  None Other Comments:  None  Review of Systems  Constitutional: Negative for fever and chills.  HENT: Negative for hearing loss and sore throat.   Eyes: Negative for blurred vision.  Respiratory: Negative for cough, hemoptysis, shortness of breath and stridor.   Cardiovascular: Negative for chest pain,  palpitations and leg swelling.  Gastrointestinal: Negative for nausea, vomiting, abdominal pain, diarrhea, constipation and blood in stool.  Genitourinary: Negative for dysuria, hematuria and flank pain.  Musculoskeletal: Positive for myalgias (Occasional right thigh pain, left lower leg pain). Negative for back pain.  Skin: Negative for rash.  Neurological: Positive for headaches (Intermittent headaches for the last month resolved with Tylenol). Negative for dizziness and weakness.  Endo/Heme/Allergies: Does not bruise/bleed easily.  Psychiatric/Behavioral: Negative for depression.    Dilation: 1 Effacement (%): Thick Station: -3 Exam by:: Drenda Freeze CNM Blood pressure 118/50, pulse 95, temperature 98.3 F (36.8 C), temperature source Oral, resp. rate 18, height 5\' 6"  (1.676 m), weight 104.055 kg (229 lb 6.4 oz), last menstrual period 12/02/2012. Exam Physical Exam  Constitutional: She is oriented to person, place, and time. She appears well-developed and well-nourished. No distress.  HENT:  Head: Normocephalic and atraumatic.  Mouth/Throat: Oropharynx is clear and moist.  Eyes: EOM are normal. Pupils are equal, round, and reactive to light. No scleral icterus.  Neck: Normal range of motion. No tracheal deviation present. No thyromegaly present.  Cardiovascular: Normal rate, regular rhythm, normal heart sounds and intact distal pulses.  Exam reveals no gallop and no friction rub.   No murmur heard. Respiratory: Effort normal and breath sounds normal. No stridor. She has no wheezes. She has no rales.  GI: Soft. Bowel sounds are normal. She exhibits no distension. There is no tenderness. There is no guarding.  Musculoskeletal: Normal range of motion. She exhibits no edema and no tenderness.  Neurological:  She is alert and oriented to person, place, and time.  Skin: Skin is warm and dry. No rash noted.  Psychiatric: She has a normal mood and affect. Her behavior is normal.    Prenatal  labs: ABO, Rh: AB/Positive/-- (11/03 0000) Antibody: Negative (11/03 0000) Rubella: Immune (11/03 0000) RPR: Nonreactive (11/03 0000)  HBsAg: Negative (11/03 0000)  HIV: Non-reactive (11/03 0000)  GBS: Positive (03/05 0000)  Diabetes Screen: 1 hr = 56 (1103)  Assessment/Plan: Diamond Bolton is a 23 y.o. female, G1P0, 4917w2d presenting for IOL for postdates.  Admitted at 19:30 and started on Cytotec at 22:00.   Wallis BambergMario Mani 09/07/2013, 9:58 PM  I have seen and examined this patient and agree the above assessment. Scarlette CalicoFrances Cresenzo-Dishmon 09/08/2013 4:08 AM

## 2013-09-08 ENCOUNTER — Encounter (HOSPITAL_COMMUNITY): Payer: 59 | Admitting: Anesthesiology

## 2013-09-08 ENCOUNTER — Inpatient Hospital Stay (HOSPITAL_COMMUNITY): Payer: 59 | Admitting: Anesthesiology

## 2013-09-08 ENCOUNTER — Encounter (HOSPITAL_COMMUNITY): Payer: Self-pay

## 2013-09-08 LAB — ABO/RH: ABO/RH(D): AB POS

## 2013-09-08 LAB — TYPE AND SCREEN
ABO/RH(D): AB POS
Antibody Screen: NEGATIVE

## 2013-09-08 LAB — RPR

## 2013-09-08 MED ORDER — OXYCODONE-ACETAMINOPHEN 5-325 MG PO TABS: 1.0000 | ORAL_TABLET | Freq: Four times a day (QID) | ORAL | Status: DC | PRN

## 2013-09-08 MED ORDER — FENTANYL CITRATE 0.05 MG/ML IJ SOLN
50.0000 ug | Freq: Once | INTRAMUSCULAR | Status: AC
  Administered 2013-09-08: 50 ug via INTRAVENOUS
  Filled 2013-09-08: qty 2

## 2013-09-08 MED ORDER — FENTANYL 2.5 MCG/ML BUPIVACAINE 1/10 % EPIDURAL INFUSION (WH - ANES)
14.0000 mL/h | INTRAMUSCULAR | Status: DC | PRN
  Filled 2013-09-08: qty 125

## 2013-09-08 MED ORDER — PHENYLEPHRINE 40 MCG/ML (10ML) SYRINGE FOR IV PUSH (FOR BLOOD PRESSURE SUPPORT)
80.0000 ug | PREFILLED_SYRINGE | INTRAVENOUS | Status: DC | PRN
  Filled 2013-09-08: qty 10

## 2013-09-08 MED ORDER — OXYTOCIN 40 UNITS IN LACTATED RINGERS INFUSION - SIMPLE MED
1.0000 m[IU]/min | INTRAVENOUS | Status: DC
  Administered 2013-09-08: 2 m[IU]/min via INTRAVENOUS
  Filled 2013-09-08: qty 1000

## 2013-09-08 MED ORDER — FENTANYL CITRATE 0.05 MG/ML IJ SOLN
100.0000 ug | INTRAMUSCULAR | Status: DC | PRN
  Administered 2013-09-08 (×5): 100 ug via INTRAVENOUS
  Filled 2013-09-08 (×6): qty 2

## 2013-09-08 MED ORDER — LIDOCAINE HCL (PF) 1 % IJ SOLN
INTRAMUSCULAR | Status: DC | PRN
  Administered 2013-09-08 (×2): 4 mL

## 2013-09-08 MED ORDER — DIPHENHYDRAMINE HCL 50 MG/ML IJ SOLN
12.5000 mg | INTRAMUSCULAR | Status: DC | PRN
  Administered 2013-09-08: 12.5 mg via INTRAVENOUS
  Filled 2013-09-08: qty 1

## 2013-09-08 MED ORDER — FENTANYL 2.5 MCG/ML BUPIVACAINE 1/10 % EPIDURAL INFUSION (WH - ANES)
INTRAMUSCULAR | Status: DC | PRN
  Administered 2013-09-08: 14 mL/h via EPIDURAL

## 2013-09-08 MED ORDER — TERBUTALINE SULFATE 1 MG/ML IJ SOLN: 0.2500 mg | Freq: Once | INTRAMUSCULAR | Status: AC | PRN

## 2013-09-08 MED ORDER — EPHEDRINE 5 MG/ML INJ: 10.0000 mg | INTRAVENOUS | Status: DC | PRN

## 2013-09-08 MED ORDER — LACTATED RINGERS IV SOLN: 500.0000 mL | Freq: Once | INTRAVENOUS | Status: DC

## 2013-09-08 MED ORDER — EPHEDRINE 5 MG/ML INJ
10.0000 mg | INTRAVENOUS | Status: DC | PRN
Start: 2013-09-08 — End: 2013-09-09
  Filled 2013-09-08: qty 4

## 2013-09-08 MED ORDER — PHENYLEPHRINE 40 MCG/ML (10ML) SYRINGE FOR IV PUSH (FOR BLOOD PRESSURE SUPPORT): 80.0000 ug | PREFILLED_SYRINGE | INTRAVENOUS | Status: DC | PRN

## 2013-09-08 NOTE — Anesthesia Preprocedure Evaluation (Addendum)
Anesthesia Evaluation  Patient identified by MRN, date of birth, ID band Patient awake    Reviewed: Allergy & Precautions, H&P , Patient's Chart, lab work & pertinent test results  Airway Mallampati: III TM Distance: >3 FB Neck ROM: Full    Dental no notable dental hx. (+) Teeth Intact   Pulmonary asthma , former smoker,  breath sounds clear to auscultation  Pulmonary exam normal       Cardiovascular negative cardio ROS  Rhythm:Regular Rate:Normal     Neuro/Psych negative neurological ROS  negative psych ROS   GI/Hepatic negative GI ROS, (+)     substance abuse  marijuana use,   Endo/Other  Morbid obesity  Renal/GU negative Renal ROS  negative genitourinary   Musculoskeletal negative musculoskeletal ROS (+)   Abdominal (+) + obese,   Peds  Hematology   Anesthesia Other Findings   Reproductive/Obstetrics (+) Pregnancy                          Anesthesia Physical Anesthesia Plan  ASA: III and emergent  Anesthesia Plan: Epidural   Post-op Pain Management:    Induction:   Airway Management Planned: Natural Airway  Additional Equipment:   Intra-op Plan:   Post-operative Plan:   Informed Consent: I have reviewed the patients History and Physical, chart, labs and discussed the procedure including the risks, benefits and alternatives for the proposed anesthesia with the patient or authorized representative who has indicated his/her understanding and acceptance.   Dental advisory given  Plan Discussed with: Anesthesiologist, CRNA and Surgeon  Anesthesia Plan Comments: (Patient for C/Section for fetal intolerance to labor. Will use epidural for C/Section. )       Anesthesia Quick Evaluation

## 2013-09-08 NOTE — Progress Notes (Signed)
Patient ID: Diamond Bolton DoctorBarbra J Weyman, female   DOB: June 14, 1990, 23 y.o.   MRN: [id number] Diamond Bolton DoctorBarbra J Childress is a 23 y.o. G1P0 at 1114w3d admitted for inductions indicated by postdates. Now on pitocin after cytotec and after FB expelled at 1630.   Subjective: "Somewhat" uncomfortable. Requests IV pain med.   Objective: BP 103/65  Pulse 84  Temp(Src) 98.6 F (37 C) (Oral)  Resp 20  Ht 5\' 6"  (1.676 m)  Wt 104.055 kg (229 lb 6.4 oz)  BMI 37.04 kg/m2  LMP 12/02/2012  Fetal Heart FHR: 130 bpm, variability: moderate,  accelerations:  Present,  decelerations:  Absent   Contractions: q 2-9, Pitocin at 8mu  SVE:   Dilation: 5 Effacement (%): 50 Station: -2 Exam by:: foley rn Last exam at 1800 by RN (unchanged from my earlier exam)  Assessment / Plan:  Labor: early Fetal Wellbeing: Cat 1 Pain Control:  Will give Fentanyl. Wants epidural later Expected mode of delivery: NSVD  Meghan Tiemann C Yisrael Obryan 09/08/2013, 7:36 PM

## 2013-09-08 NOTE — Progress Notes (Signed)
Lynelle DoctorBarbra J Hazan is a 23 y.o. G1P0 at 2716w3d  admitted for induction of labor due to postdates.  Subjective:  Doing well. Starting to get more uncomfortable. Wants to "hurry up and have a baby"/. Wants epidural now. +FM.    Objective: BP 122/63  Pulse 74  Temp(Src) 98.6 F (37 C) (Oral)  Resp 18  Ht 5\' 6"  (1.676 m)  Wt 104.055 kg (229 lb 6.4 oz)  BMI 37.04 kg/m2  LMP 12/02/2012      FHT:  FHR: 145 bpm, variability: moderate,  accelerations:  Present,  decelerations:  Absent UC:   irregular, every 2-5 minutes SVE:   Dilation: 4.5 Effacement (%): 60 Station: -2 Exam by:: Dr. Reola CalkinsBeck  Labs: Lab Results  Component Value Date   WBC 8.3 09/07/2013   HGB 12.6 09/07/2013   HCT 36.4 09/07/2013   MCV 86.3 09/07/2013   PLT 276 09/07/2013    Assessment / Plan: IOL due to postdates   Labor: progressing on pitocin. AROM performed with return on clear fluid.  Fetal Wellbeing:  Category I Pain Control:  now wanting an epidural I/D:  GBS pos- on PCN Anticipated MOD:  NSVD  Remberto Lienhard L Yulonda Wheeling 09/08/2013, 9:05 PM

## 2013-09-08 NOTE — Progress Notes (Signed)
   Diamond Bolton is a 23 y.o. G1P0 at 10545w3d  admitted for induction of labor due to Post dates. .  Subjective: Feeling a lot of contractions  Objective: BP 108/53  Pulse 79  Temp(Src) 97.8 F (36.6 C) (Oral)  Resp 16  Ht 5\' 6"  (1.676 m)  Wt 104.055 kg (229 lb 6.4 oz)  BMI 37.04 kg/m2  LMP 12/02/2012    FHT:  FHR: 150 bpm, variability: moderate,  accelerations:  Present,  decelerations:  Present occ variable decel.   UC:   irregular, every 1-3 minutes SVE:   Dilation: 1 Effacement (%): Thick Station: -3 Exam by:: Drenda FreezeFran CNM Foley placed and inflated with 60cc H20 Labs: Lab Results  Component Value Date   WBC 8.3 09/07/2013   HGB 12.6 09/07/2013   HCT 36.4 09/07/2013   MCV 86.3 09/07/2013   PLT 276 09/07/2013    Assessment / Plan: IOL, ripening phase  Labor: Progressing normally Fetal Wellbeing:  Category I Pain Control:  Fentanyl Anticipated MOD:  NSVD  Jacklyn ShellFrances Cresenzo-Dishmon 09/08/2013, 5:12 AM

## 2013-09-08 NOTE — Progress Notes (Signed)
Patient ID: Diamond Bolton, female   DOB: May 06, 1991, 23 y.o.   MRN: [id number] Diamond Bolton is a 23 y.o. G1P0 at 5133w3d admitted for IOL indicated by postdates Subjective: Comfortable. FB out at 1630.  Objective: BP 115/70  Pulse 80  Temp(Src) 98.3 F (36.8 C) (Oral)  Resp 20  Ht 5\' 6"  (1.676 m)  Wt 104.055 kg (229 lb 6.4 oz)  BMI 37.04 kg/m2  LMP 12/02/2012  Fetal Heart FHR: 140 bpm, variability: moderate,  accelerations:  Present,  decelerations:  Absent   Contractions: irregular mild  SVE: post, 4-5/40/-2 bulging  Assessment / Plan:  Labor: Cx favorable post FB> will start pitocin IOL Fetal Wellbeing: Category 1 Pain Control:  n/a Expected mode of delivery: NSVD  Diamond Bolton 09/08/2013, 4:30 PM

## 2013-09-08 NOTE — Anesthesia Procedure Notes (Signed)
Epidural Patient location during procedure: OB Start time: 09/08/2013 9:32 PM  Staffing Anesthesiologist: Arif Amendola A. Performed by: anesthesiologist   Preanesthetic Checklist Completed: patient identified, site marked, surgical consent, pre-op evaluation, timeout performed, IV checked, risks and benefits discussed and monitors and equipment checked  Epidural Patient position: sitting Prep: site prepped and draped and DuraPrep Patient monitoring: continuous pulse ox and blood pressure Approach: midline Location: L3-L4 Injection technique: LOR air  Needle:  Needle type: Tuohy  Needle gauge: 17 G Needle length: 9 cm and 9 Needle insertion depth: 5 cm cm Catheter type: closed end flexible Catheter size: 19 Gauge Catheter at skin depth: 10 cm Test dose: negative and Other  Assessment Events: blood not aspirated, injection not painful, no injection resistance, negative IV test and no paresthesia  Additional Notes Patient identified. Risks and benefits discussed including failed block, incomplete  Pain control, post dural puncture headache, nerve damage, paralysis, blood pressure Changes, nausea, vomiting, reactions to medications-both toxic and allergic and post Partum back pain. All questions were answered. Patient expressed understanding and wished to proceed. Sterile technique was used throughout procedure. Epidural site was Dressed with sterile barrier dressing. No paresthesias, signs of intravascular injection Or signs of intrathecal spread were encountered.  Patient was more comfortable after the epidural was dosed. Please see RN's note for documentation of vital signs and FHR which are stable.

## 2013-09-09 ENCOUNTER — Encounter (HOSPITAL_COMMUNITY): Payer: Self-pay

## 2013-09-09 ENCOUNTER — Encounter (HOSPITAL_COMMUNITY)
Admission: RE | Disposition: A | Discharge status: Home / Self Care | Payer: Self-pay | Source: Ambulatory Visit | Attending: Family Medicine

## 2013-09-09 DIAGNOSIS — O99344 Other mental disorders complicating childbirth: Secondary | ICD-10-CM

## 2013-09-09 DIAGNOSIS — Z98891 History of uterine scar from previous surgery: Secondary | ICD-10-CM

## 2013-09-09 DIAGNOSIS — O480 Post-term pregnancy: Secondary | ICD-10-CM | POA: Diagnosis not present

## 2013-09-09 LAB — CBC
HEMATOCRIT: 29.9 % — AB (ref 36.0–46.0)
Hemoglobin: 10.1 g/dL — ABNORMAL LOW (ref 12.0–15.0)
MCH: 29.1 pg (ref 26.0–34.0)
MCHC: 33.8 g/dL (ref 30.0–36.0)
MCV: 86.2 fL (ref 78.0–100.0)
Platelets: 194 10*3/uL (ref 150–400)
RBC: 3.47 MIL/uL — ABNORMAL LOW (ref 3.87–5.11)
RDW: 13.3 % (ref 11.5–15.5)
WBC: 13.3 10*3/uL — ABNORMAL HIGH (ref 4.0–10.5)

## 2013-09-09 SURGERY — Surgical Case
Anesthesia: Epidural | Site: Abdomen

## 2013-09-09 MED ORDER — METOCLOPRAMIDE HCL 5 MG/ML IJ SOLN
10.0000 mg | Freq: Three times a day (TID) | INTRAMUSCULAR | Status: DC | PRN
  Administered 2013-09-09: 10 mg via INTRAVENOUS

## 2013-09-09 MED ORDER — METOCLOPRAMIDE HCL 5 MG/ML IJ SOLN
INTRAMUSCULAR | Status: AC
  Filled 2013-09-09: qty 2

## 2013-09-09 MED ORDER — ONDANSETRON HCL 4 MG/2ML IJ SOLN
INTRAMUSCULAR | Status: DC | PRN
  Administered 2013-09-09: 4 mg via INTRAVENOUS

## 2013-09-09 MED ORDER — DIPHENHYDRAMINE HCL 25 MG PO CAPS: 25.0000 mg | ORAL_CAPSULE | Freq: Four times a day (QID) | ORAL | Status: DC | PRN

## 2013-09-09 MED ORDER — LANOLIN HYDROUS EX OINT: 1.0000 "application " | TOPICAL_OINTMENT | CUTANEOUS | Status: DC | PRN

## 2013-09-09 MED ORDER — DIPHENHYDRAMINE HCL 50 MG/ML IJ SOLN: 25.0000 mg | INTRAMUSCULAR | Status: DC | PRN

## 2013-09-09 MED ORDER — WITCH HAZEL-GLYCERIN EX PADS: 1.0000 "application " | MEDICATED_PAD | CUTANEOUS | Status: DC | PRN

## 2013-09-09 MED ORDER — SIMETHICONE 80 MG PO CHEW
80.0000 mg | CHEWABLE_TABLET | ORAL | Status: DC | PRN
  Administered 2013-09-09: 80 mg via ORAL
  Filled 2013-09-09: qty 1

## 2013-09-09 MED ORDER — ZOLPIDEM TARTRATE 5 MG PO TABS: 5.0000 mg | ORAL_TABLET | Freq: Every evening | ORAL | Status: DC | PRN

## 2013-09-09 MED ORDER — IBUPROFEN 600 MG PO TABS
600.0000 mg | ORAL_TABLET | Freq: Four times a day (QID) | ORAL | Status: DC
Start: 2013-09-09 — End: 2013-09-11
  Administered 2013-09-09 – 2013-09-11 (×9): 600 mg via ORAL
  Filled 2013-09-09 (×10): qty 1

## 2013-09-09 MED ORDER — PRENATAL MULTIVITAMIN CH
1.0000 | ORAL_TABLET | Freq: Every day | ORAL | Status: DC
  Administered 2013-09-09 – 2013-09-11 (×3): 1 via ORAL
  Filled 2013-09-09 (×3): qty 1

## 2013-09-09 MED ORDER — NALOXONE HCL 0.4 MG/ML IJ SOLN: 0.4000 mg | INTRAMUSCULAR | Status: DC | PRN

## 2013-09-09 MED ORDER — MORPHINE SULFATE 0.5 MG/ML IJ SOLN
INTRAMUSCULAR | Status: AC
  Filled 2013-09-09: qty 10

## 2013-09-09 MED ORDER — OXYCODONE-ACETAMINOPHEN 5-325 MG PO TABS
1.0000 | ORAL_TABLET | ORAL | Status: DC | PRN
  Administered 2013-09-09: 1 via ORAL
  Administered 2013-09-09: 2 via ORAL
  Administered 2013-09-09: 1 via ORAL
  Administered 2013-09-10 (×2): 2 via ORAL
  Administered 2013-09-10: 1 via ORAL
  Administered 2013-09-10 – 2013-09-11 (×4): 2 via ORAL
  Filled 2013-09-09 (×2): qty 2
  Filled 2013-09-09: qty 1
  Filled 2013-09-09 (×4): qty 2
  Filled 2013-09-09: qty 1
  Filled 2013-09-09 (×2): qty 2

## 2013-09-09 MED ORDER — CEFAZOLIN SODIUM-DEXTROSE 2-3 GM-% IV SOLR
INTRAVENOUS | Status: AC
  Filled 2013-09-09: qty 50

## 2013-09-09 MED ORDER — KETOROLAC TROMETHAMINE 30 MG/ML IJ SOLN
INTRAMUSCULAR | Status: AC
  Filled 2013-09-09: qty 1

## 2013-09-09 MED ORDER — NALBUPHINE HCL 10 MG/ML IJ SOLN
5.0000 mg | INTRAMUSCULAR | Status: DC | PRN
  Administered 2013-09-09: 5 mg via INTRAVENOUS
  Filled 2013-09-09: qty 1

## 2013-09-09 MED ORDER — KETOROLAC TROMETHAMINE 30 MG/ML IJ SOLN: 30.0000 mg | Freq: Four times a day (QID) | INTRAMUSCULAR | Status: AC | PRN

## 2013-09-09 MED ORDER — LIDOCAINE-EPINEPHRINE (PF) 2 %-1:200000 IJ SOLN
INTRAMUSCULAR | Status: DC | PRN
  Administered 2013-09-09 (×2): 5 mL via EPIDURAL
  Administered 2013-09-09: 2 mL via EPIDURAL
  Administered 2013-09-09: 5 mL via EPIDURAL
  Administered 2013-09-09: 3 mL via EPIDURAL

## 2013-09-09 MED ORDER — MEPERIDINE HCL 25 MG/ML IJ SOLN
INTRAMUSCULAR | Status: AC
  Filled 2013-09-09: qty 1

## 2013-09-09 MED ORDER — SCOPOLAMINE 1 MG/3DAYS TD PT72
MEDICATED_PATCH | TRANSDERMAL | Status: AC
  Filled 2013-09-09: qty 1

## 2013-09-09 MED ORDER — SCOPOLAMINE 1 MG/3DAYS TD PT72
1.0000 | MEDICATED_PATCH | Freq: Once | TRANSDERMAL | Status: DC
  Administered 2013-09-09: 1.5 mg via TRANSDERMAL

## 2013-09-09 MED ORDER — SODIUM CHLORIDE 0.9 % IJ SOLN: 3.0000 mL | INTRAMUSCULAR | Status: DC | PRN

## 2013-09-09 MED ORDER — DIBUCAINE 1 % RE OINT: 1.0000 "application " | TOPICAL_OINTMENT | RECTAL | Status: DC | PRN

## 2013-09-09 MED ORDER — SODIUM BICARBONATE 8.4 % IV SOLN
INTRAVENOUS | Status: AC
  Filled 2013-09-09: qty 50

## 2013-09-09 MED ORDER — MENTHOL 3 MG MT LOZG: 1.0000 | LOZENGE | OROMUCOSAL | Status: DC | PRN

## 2013-09-09 MED ORDER — SENNOSIDES-DOCUSATE SODIUM 8.6-50 MG PO TABS
2.0000 | ORAL_TABLET | ORAL | Status: DC
  Administered 2013-09-10 – 2013-09-11 (×2): 2 via ORAL
  Filled 2013-09-09 (×2): qty 2

## 2013-09-09 MED ORDER — DIPHENHYDRAMINE HCL 25 MG PO CAPS: 25.0000 mg | ORAL_CAPSULE | ORAL | Status: DC | PRN

## 2013-09-09 MED ORDER — TERBUTALINE SULFATE 1 MG/ML IJ SOLN
INTRAMUSCULAR | Status: AC
  Filled 2013-09-09: qty 1

## 2013-09-09 MED ORDER — OXYTOCIN 10 UNIT/ML IJ SOLN
INTRAMUSCULAR | Status: AC
  Filled 2013-09-09: qty 4

## 2013-09-09 MED ORDER — MEPERIDINE HCL 25 MG/ML IJ SOLN
INTRAMUSCULAR | Status: DC | PRN
  Administered 2013-09-09 (×2): 12.5 mg via INTRAVENOUS

## 2013-09-09 MED ORDER — TETANUS-DIPHTH-ACELL PERTUSSIS 5-2.5-18.5 LF-MCG/0.5 IM SUSP: 0.5000 mL | Freq: Once | INTRAMUSCULAR | Status: DC

## 2013-09-09 MED ORDER — NALOXONE HCL 1 MG/ML IJ SOLN
1.0000 ug/kg/h | INTRAVENOUS | Status: DC | PRN
  Filled 2013-09-09: qty 2

## 2013-09-09 MED ORDER — FENTANYL CITRATE 0.05 MG/ML IJ SOLN: 25.0000 ug | INTRAMUSCULAR | Status: DC | PRN

## 2013-09-09 MED ORDER — LIDOCAINE-EPINEPHRINE (PF) 2 %-1:200000 IJ SOLN
INTRAMUSCULAR | Status: AC
  Filled 2013-09-09: qty 20

## 2013-09-09 MED ORDER — SIMETHICONE 80 MG PO CHEW
80.0000 mg | CHEWABLE_TABLET | ORAL | Status: DC
  Administered 2013-09-10: 80 mg via ORAL
  Filled 2013-09-09 (×2): qty 1

## 2013-09-09 MED ORDER — LACTATED RINGERS IV SOLN
INTRAVENOUS | Status: DC
  Administered 2013-09-09: 11:00:00 via INTRAVENOUS

## 2013-09-09 MED ORDER — NALBUPHINE HCL 10 MG/ML IJ SOLN: 5.0000 mg | INTRAMUSCULAR | Status: DC | PRN

## 2013-09-09 MED ORDER — LACTATED RINGERS IV SOLN
40.0000 [IU] | INTRAVENOUS | Status: DC | PRN
  Administered 2013-09-09 (×2): 40 [IU] via INTRAVENOUS

## 2013-09-09 MED ORDER — MEPERIDINE HCL 25 MG/ML IJ SOLN
6.2500 mg | INTRAMUSCULAR | Status: AC | PRN
  Administered 2013-09-09 (×2): 6.25 mg via INTRAVENOUS

## 2013-09-09 MED ORDER — ONDANSETRON HCL 4 MG/2ML IJ SOLN
INTRAMUSCULAR | Status: AC
  Filled 2013-09-09: qty 2

## 2013-09-09 MED ORDER — CEFAZOLIN SODIUM-DEXTROSE 2-3 GM-% IV SOLR
INTRAVENOUS | Status: DC | PRN
  Administered 2013-09-09: 2 g via INTRAVENOUS

## 2013-09-09 MED ORDER — PHENYLEPHRINE 40 MCG/ML (10ML) SYRINGE FOR IV PUSH (FOR BLOOD PRESSURE SUPPORT)
PREFILLED_SYRINGE | INTRAVENOUS | Status: AC
  Filled 2013-09-09: qty 5

## 2013-09-09 MED ORDER — ONDANSETRON HCL 4 MG PO TABS: 4.0000 mg | ORAL_TABLET | ORAL | Status: DC | PRN

## 2013-09-09 MED ORDER — OXYTOCIN 40 UNITS IN LACTATED RINGERS INFUSION - SIMPLE MED: 62.5000 mL/h | INTRAVENOUS | Status: AC

## 2013-09-09 MED ORDER — TERBUTALINE SULFATE 1 MG/ML IJ SOLN
0.2500 mg | Freq: Once | INTRAMUSCULAR | Status: AC
  Administered 2013-09-09: 0.25 mg via SUBCUTANEOUS

## 2013-09-09 MED ORDER — ONDANSETRON HCL 4 MG/2ML IJ SOLN: 4.0000 mg | INTRAMUSCULAR | Status: DC | PRN

## 2013-09-09 MED ORDER — LACTATED RINGERS IV SOLN
INTRAVENOUS | Status: DC | PRN
  Administered 2013-09-09 (×2): via INTRAVENOUS

## 2013-09-09 MED ORDER — SIMETHICONE 80 MG PO CHEW
80.0000 mg | CHEWABLE_TABLET | Freq: Three times a day (TID) | ORAL | Status: DC
  Administered 2013-09-09 – 2013-09-11 (×7): 80 mg via ORAL
  Filled 2013-09-09 (×8): qty 1

## 2013-09-09 MED ORDER — BUPIVACAINE HCL (PF) 0.25 % IJ SOLN
INTRAMUSCULAR | Status: DC | PRN
  Administered 2013-09-09: 10 mL

## 2013-09-09 MED ORDER — KETOROLAC TROMETHAMINE 30 MG/ML IJ SOLN
30.0000 mg | Freq: Four times a day (QID) | INTRAMUSCULAR | Status: AC | PRN
  Administered 2013-09-09: 30 mg via INTRAVENOUS

## 2013-09-09 MED ORDER — DIPHENHYDRAMINE HCL 50 MG/ML IJ SOLN
12.5000 mg | INTRAMUSCULAR | Status: DC | PRN
  Administered 2013-09-09: 12.5 mg via INTRAVENOUS
  Filled 2013-09-09: qty 1

## 2013-09-09 MED ORDER — MORPHINE SULFATE (PF) 0.5 MG/ML IJ SOLN
INTRAMUSCULAR | Status: DC | PRN
  Administered 2013-09-09: 3 mg via EPIDURAL

## 2013-09-09 MED ORDER — METHYLERGONOVINE MALEATE 0.2 MG/ML IJ SOLN
INTRAMUSCULAR | Status: DC | PRN
  Administered 2013-09-09: 0.2 mg via INTRAMUSCULAR

## 2013-09-09 MED ORDER — ONDANSETRON HCL 4 MG/2ML IJ SOLN: 4.0000 mg | Freq: Three times a day (TID) | INTRAMUSCULAR | Status: DC | PRN

## 2013-09-09 MED ORDER — BUPIVACAINE HCL (PF) 0.25 % IJ SOLN
INTRAMUSCULAR | Status: AC
  Filled 2013-09-09: qty 30

## 2013-09-09 SURGICAL SUPPLY — 27 items
CLAMP CORD UMBIL (MISCELLANEOUS) IMPLANT
CLOTH BEACON ORANGE TIMEOUT ST (SAFETY) ×2 IMPLANT
DRAPE LG THREE QUARTER DISP (DRAPES) IMPLANT
DRSG OPSITE POSTOP 4X10 (GAUZE/BANDAGES/DRESSINGS) ×2 IMPLANT
DURAPREP 26ML APPLICATOR (WOUND CARE) ×2 IMPLANT
ELECT REM PT RETURN 9FT ADLT (ELECTROSURGICAL) ×2
ELECTRODE REM PT RTRN 9FT ADLT (ELECTROSURGICAL) ×1 IMPLANT
EXTRACTOR VACUUM M CUP 4 TUBE (SUCTIONS) IMPLANT
GLOVE BIOGEL PI IND STRL 7.5 (GLOVE) ×2 IMPLANT
GLOVE BIOGEL PI INDICATOR 7.5 (GLOVE) ×2
GLOVE ECLIPSE 7.5 STRL STRAW (GLOVE) ×2 IMPLANT
GOWN STRL REUS W/TWL LRG LVL3 (GOWN DISPOSABLE) ×6 IMPLANT
KIT ABG SYR 3ML LUER SLIP (SYRINGE) IMPLANT
NDL HYPO 25X5/8 SAFETYGLIDE (NEEDLE) IMPLANT
NEEDLE HYPO 22GX1.5 SAFETY (NEEDLE) ×2 IMPLANT
NEEDLE HYPO 25X5/8 SAFETYGLIDE (NEEDLE) IMPLANT
NS IRRIG 1000ML POUR BTL (IV SOLUTION) ×2 IMPLANT
PACK C SECTION WH (CUSTOM PROCEDURE TRAY) ×2 IMPLANT
PAD OB MATERNITY 4.3X12.25 (PERSONAL CARE ITEMS) ×2 IMPLANT
RTRCTR C-SECT PINK 25CM LRG (MISCELLANEOUS) IMPLANT
SUT VIC AB 0 CTX 36 (SUTURE) ×6
SUT VIC AB 0 CTX36XBRD ANBCTRL (SUTURE) ×3 IMPLANT
SUT VIC AB 4-0 KS 27 (SUTURE) ×2 IMPLANT
SYR 30ML LL (SYRINGE) ×2 IMPLANT
TOWEL OR 17X24 6PK STRL BLUE (TOWEL DISPOSABLE) ×2 IMPLANT
TRAY FOLEY CATH 14FR (SET/KITS/TRAYS/PACK) ×2 IMPLANT
WATER STERILE IRR 1000ML POUR (IV SOLUTION) ×2 IMPLANT

## 2013-09-09 NOTE — Consult Note (Signed)
Neonatology Note:   Attendance at C-section:    I was asked by Dr. Adrian BlackwaterStinson to attend this primary C/S at 41 3/7 weeks due to fetal intolerance to labor. The mother is a G1P0 AB pos, GBS pos with past history of marijuana use and cigarette smoking, but not during this pregnancy. ROM 4 hours prior to delivery, fluid clear. Mother received Pen G beginning > 4 hours prior to delivery and she remained afebrile during labor. At delivery, the baby had an unusual posture, with upper extremities flexed and clenched and back extended. He held his breath; we did bulb suctioning and got a large amount of mucous secretions, then gave stimulation. He cried weakly, but did not continue, requiring stimulation constantly to breathe at all. His HR was about 100 at 1 min of life, but drifted downward due to lack of respiratory effort. We began PPV at about 2 min. He coughed and we bulb suctioned again. He behaved as if he had fluid occluding his airway, so we did DeLee suctioning for about 4 ml of thick, clear secretions. He cried once, then became apneic again, and we did PPV again at about 5-6 min of life for another 30 seconds, with prompt improvement in color, HR, and finally, vigorous crying. After that, the baby appeared fully recovered. On ausc, he had a 3/6 systolic heart murmur over the left sternal border. We placed a pulse oximeter, which showed O2 saturations of 96% in room air. Due to his sluggish start and murmur, I decided to admit him to NICU for observation and to rule out sepsis. Ap 5/8/9. The baby was seen briefly by his mother in the OR and I spoke with both parents about the reason for admission to NICU. We transported him to NICU in room air, with his father in attendance.   Doretha Souhristie C. Aveah Castell, MD

## 2013-09-09 NOTE — Lactation Note (Signed)
This note was copied from the chart of Diamond Leticia Buice. Lactation Consultation Note Initial consultation; baby in NICU; mom pumping. Mom has flat nipples which will possibly make latching difficult.  Reviewed NICU breastfeeding book, reviewed pumping and hand expressing, answered questions. Enc mom to call for help if she has any concerns.  Patient Name: Diamond Bolton ClimesBarbra Bivens QMVHQ'IToday's Date: 09/09/2013 Reason for consult: Initial assessment   Maternal Data Formula Feeding for Exclusion: Yes Reason for exclusion: Admission to Intensive Care Unit (ICU) post-partum Has patient been taught Hand Expression?: Yes Does the patient have breastfeeding experience prior to this delivery?: No  Feeding    LATCH Score/Interventions                      Lactation Tools Discussed/Used WIC Program: Yes Pump Review: Setup, frequency, and cleaning;Milk Storage Initiated by:: BS Date initiated:: 09/09/13   Consult Status Consult Status: Follow-up Follow-up type: In-patient    Talmadge Coventrylizabeth F Letoya Stallone 09/09/2013, 12:18 PM

## 2013-09-09 NOTE — Progress Notes (Signed)
Patient ID: Diamond Bolton, female   DOB: 1991-01-06, 23 y.o.   MRN: [id number]  Patient having repetative variable and late decelerations with contractions despite attempting to decrease or stop pitocin.  Amnioinfusion done without change.  The risks of cesarean section discussed with the patient included but were not limited to: bleeding which may require transfusion or reoperation; infection which may require antibiotics; injury to bowel, bladder, ureters or other surrounding organs; injury to the fetus; need for additional procedures including hysterectomy in the event of a life-threatening hemorrhage; placental abnormalities wth subsequent pregnancies, incisional problems, thromboembolic phenomenon and other postoperative/anesthesia complications. The patient concurred with the proposed plan, giving informed written consent for the procedure.   Patient has been NPO since 4pm she will remain NPO for procedure. Anesthesia and OR aware.  Preoperative prophylactic Ancef ordered on call to the OR.  To OR when ready.

## 2013-09-09 NOTE — Anesthesia Postprocedure Evaluation (Signed)
  Anesthesia Post-op Note  Patient: Diamond Bolton  Procedure(s) Performed: Procedure(s): CESAREAN SECTION (N/A)  Patient Location: PACU  Anesthesia Type:Epidural  Level of Consciousness: awake, alert  and oriented  Airway and Oxygen Therapy: Patient Spontanous Breathing  Post-op Pain: none  Post-op Assessment: Post-op Vital signs reviewed, Patient's Cardiovascular Status Stable, Respiratory Function Stable, Patent Airway, No signs of Nausea or vomiting, Pain level controlled, No headache and No backache  Post-op Vital Signs: Reviewed and stable  Last Vitals:  Filed Vitals:   09/09/13 0300  BP: 101/71  Pulse: 94  Temp: 37 C  Resp: 14    Complications: No apparent anesthesia complications

## 2013-09-09 NOTE — Transfer of Care (Signed)
Immediate Anesthesia Transfer of Care Note  Patient: Diamond JohnsBarbra J Michaelson  Procedure(s) Performed: Procedure(s): CESAREAN SECTION (N/A)  Patient Location: PACU  Anesthesia Type:Epidural  Level of Consciousness: awake, alert , oriented and patient cooperative  Airway & Oxygen Therapy: Patient Spontanous Breathing  Post-op Assessment: Report given to PACU RN and Post -op Vital signs reviewed and stable  Post vital signs: Reviewed and stable  Complications: No apparent anesthesia complications

## 2013-09-09 NOTE — Op Note (Signed)
Doristine JohnsBarbra J Lague PROCEDURE DATE: 09/07/2013 - 09/09/2013  PREOPERATIVE DIAGNOSES: Intrauterine pregnancy at  4362w4d weeks gestation; fetal intolerance to labor with repetetive late decelerations  POSTOPERATIVE DIAGNOSES: The same, asynclitism  PROCEDURE: Primary Low Transverse Cesarean Section  SURGEON:  Dr. Candelaria CelesteJacob Stinson   ASSISTANT:  Dr. Rulon AbideKeli Harding Thomure  ANESTHESIOLOGIST: Dr. Malen GauzeFoster  INDICATIONS: Diamond Bolton is a 23 y.o. G1P1001 at 4962w4d here for cesarean section secondary to the indications listed under preoperative diagnoses; please see preoperative note for further details.  The risks of cesarean section were discussed with the patient including but were not limited to: bleeding which may require transfusion or reoperation; infection which may require antibiotics; injury to bowel, bladder, ureters or other surrounding organs; injury to the fetus; need for additional procedures including hysterectomy in the event of a life-threatening hemorrhage; placental abnormalities wth subsequent pregnancies, incisional problems, thromboembolic phenomenon and other postoperative/anesthesia complications.   The patient concurred with the proposed plan, giving informed written consent for the procedure.    FINDINGS:  Viable female infant in cephalic presentation. Right occiput posterior and asynclitic.  Apgars 5,8 and 9.  Clear amniotic fluid.  Intact placenta, three vessel cord.  Normal uterus, fallopian tubes and ovaries bilaterally.  ANESTHESIA: Epidural INTRAVENOUS FLUIDS: 2000 ml ESTIMATED BLOOD LOSS: 900 ml URINE OUTPUT:  75 ml SPECIMENS: Placenta sent to L&D COMPLICATIONS: None immediate  PROCEDURE IN DETAIL:  The patient preoperatively received intravenous antibiotics and had sequential compression devices applied to her lower extremities.  She was then taken to the operating room where the epidural anesthesia was dosed up to surgical level and was found to be adequate. She was then placed in a dorsal  supine position with a leftward tilt, and prepped and draped in a sterile manner.  A foley catheter was placed into her bladder and attached to constant gravity.  After an adequate timeout was performed, a Pfannenstiel skin incision was made with scalpel and carried through to the underlying layer of fascia. The fascia was incised in the midline, and this incision was extended bilaterally bluntly.  Kocher clamps were applied to the superior aspect of the fascial incision and the underlying rectus muscles were dissected off bluntly. The rectus muscles were separated in the midline bluntly and the peritoneum was entered bluntly. Attention was turned to the lower uterine segment where a low transverse hysterotomy was made with a scalpel and extended bilaterally bluntly.  The infant was successfully delivered, the cord was clamped and cut and the infant was handed over to awaiting neonatology team. Cord pH was collected. Uterine massage was then administered, and the placenta delivered intact with a three-vessel cord.  The uterus was noted to be boggy despite pitocin and methergine was administered with good resulting tone.  The uterus was then cleared of clot and debris.  The hysterotomy was closed with 0 Vicryl in a running locked fashion, and an imbricating layer was also placed with 0 Vicryl. The pelvis was cleared of all clot and debris. Hemostasis was confirmed on all surfaces.  The fascia was then closed using 0 Vicryl in a running fashion.  The subcutaneous layer was irrigated, and 10 ml of 0.25% Marcaine was injected subcutaneously around the incision.  The skin was closed with a 4-0 Vicryl subcuticular stitch. The patient tolerated the procedure well. Sponge, lap, instrument and needle counts were correct x 2.  She was taken to the recovery room in stable condition.   Of note, at the end of the case it  was noticed that the cord blood had not been sent so it was sent approx after having been collected.  Cord pH was 7.255 arterial.

## 2013-09-09 NOTE — Addendum Note (Signed)
Addendum created 09/09/13 1704 by Angela Adamana G Yuki Purves, CRNA   Modules edited: Notes Section   Notes Section:  File: [id number]

## 2013-09-09 NOTE — Anesthesia Postprocedure Evaluation (Signed)
  Anesthesia Post-op Note  Patient: Diamond JohnsBarbra J Assad  Procedure(s) Performed: Procedure(s): CESAREAN SECTION (N/A)  Patient Location: [facility]  Anesthesia Type:Epidural  Level of Consciousness: awake and alert   Airway and Oxygen Therapy: Patient Spontanous Breathing  Post-op Pain: mild  Post-op Assessment: Post-op Vital signs reviewed, Patient's Cardiovascular Status Stable, Respiratory Function Stable, No signs of Nausea or vomiting, Pain level controlled, No headache, No residual numbness and No residual motor weakness  Post-op Vital Signs: Reviewed and stable  Last Vitals:  Filed Vitals:   09/09/13 1415  BP: 112/49  Pulse: 100  Temp: 36.9 C  Resp: 20    Complications: No apparent anesthesia complications

## 2013-09-10 NOTE — Lactation Note (Signed)
This note was copied from the chart of [name]. Lactation Consultation Note  Patient Name: [name] HYQMV'HToday's Date: 09/10/2013 Reason for consult: Follow-up assessment;NICU baby  Mom stated infant has breastfed some in NICU.  Mom pumped 5 times in past 24 hrs and getting only small tiny drops.  Encouraged mom to keep the pumping log in book and reviewed hands-on pumping with hand expression at end of pumping session for increasing milk output and encouraging milk production.  [facility]  Possible discharge on Tuesday per mom.     Consult Status Consult Status: Follow-up Date: 09/11/13 Follow-up type: In-patient    Claiborne RiggStephanie Walker Jun Rightmyer 09/10/2013, 5:07 PM

## 2013-09-10 NOTE — Progress Notes (Signed)
Clinical Social Work Department PSYCHOSOCIAL ASSESSMENT - MATERNAL/CHILD 09/10/2013  Patient:  Diamond Bolton,Diamond Bolton  Account Number:  [id number]  Admit Date:  09/07/2013  Childs Name:   Diamond Bolton    Clinical Social Worker:  Tylesha Gibeault, LCSW   Date/Time:  09/10/2013 10:30 AM  Date Referred:  09/10/2013   Referral source  NICU     Referred reason  NICU   Other referral source:    I:  FAMILY / HOME ENVIRONMENT Child's legal guardian:  PARENT  Guardian - Name Guardian - Age Guardian - Address  Flam,Diamond Bolton [address] [place], [place] [address]  Brown, Diamond Bolton     Other household support members/support persons Other support:    II  PSYCHOSOCIAL DATA Information Source:    Financial and Community Resources Employment:   Financial resources:  Private Insurance If Medicaid - County:    School / Grade:   Maternity Care Coordinator / Child Services Coordination / Early Interventions:  Cultural issues impacting care:    III  STRENGTHS Strengths  Supportive family/friends  Home prepared for Child (including basic supplies)  Adequate Resources   Strength comment:    IV  RISK FACTORS AND CURRENT PROBLEMS Current Problem:       V  SOCIAL WORK ASSESSMENT Met with mother who was pleasant and receptive to social work intervention.  Mother is a single parents with no other dependents.   Both parents are employed and mother reports plan to return to work.  Mother seems to be coping well with newborn's NICU admission.  Informed that she have spoken with the medical team and please that he is doing better.   Mother denies any hx of mental illness or substance abuse.  No acute social concerns related at this time.  CSW will follow PRN.      VI SOCIAL WORK PLAN Social Work Plan  Psychosocial Support/Ongoing Assessment of Needs    

## 2013-09-10 NOTE — Progress Notes (Signed)
Subjective: Postpartum Day 1: Cesarean Delivery Patient reports incisional pain, tolerating PO and no problems voiding.  Baby is doing well in NICU.  Objective: Vital signs in last 24 hours: Temp:  [97.7 F (36.5 C)-98.4 F (36.9 C)] 98 F (36.7 C) (04/12 0604) Pulse Rate:  [90-105] 90 (04/12 0604) Resp:  [18-20] 18 (04/12 0604) BP: (97-132)/(49-74) 108/51 mmHg (04/12 0604) SpO2:  [96 %-100 %] 100 % (04/12 0604)  Physical Exam:  General: alert and no distress Lochia: appropriate Uterine Fundus: firm Incision: healing well, no significant drainage DVT Evaluation: No evidence of DVT seen on physical exam. Negative Homan's sign.   Recent Labs  09/07/13 2035 09/09/13 0725  HGB 12.6 10.1*  HCT 36.4 29.9*    Assessment/Plan: Status post Cesarean section. Doing well postoperatively.  Continue current care. Breastfeeding, Nexplanon for contraception Discharge home tomorrow if stable  Diamond Bolton A Aziel Morgan,MD 09/10/2013, 8:52 AM

## 2013-09-11 ENCOUNTER — Encounter (HOSPITAL_COMMUNITY): Payer: Self-pay | Admitting: Family Medicine

## 2013-09-11 MED ORDER — OXYCODONE-ACETAMINOPHEN 5-325 MG PO TABS: 1.0000 | ORAL_TABLET | ORAL | Status: DC | PRN

## 2013-09-11 MED ORDER — IBUPROFEN 600 MG PO TABS: 600.0000 mg | ORAL_TABLET | Freq: Four times a day (QID) | ORAL | Status: DC

## 2013-09-11 NOTE — Progress Notes (Signed)
Pt discharged to home with significant other and baby.  Condition stable.  Pt ambulated to car with NT.  No equipment for home ordered at discharge.

## 2013-09-11 NOTE — Op Note (Signed)
Attestation of Attending Supervision of Obstetric Fellow During Surgery: Surgery was performed by the Obstetric Fellow under my supervision and collaboration.  I have reviewed the Obstetric Fellow's operative report, and I agree with the documentation.  I was present and scrubbed for the entire procedure.    Shahab Polhamus, DO Attending Physician Faculty Practice, [facility]  

## 2013-09-11 NOTE — Progress Notes (Signed)
Ur chart review completed.  

## 2013-09-11 NOTE — Lactation Note (Signed)
This note was copied from the chart of Diamond Azhane Busbee. Lactation Consultation Note Baby fussy, unable to latch on mom's nipple d/t to large for baby's mouth and flat. DEBP to pre-pump to pull nipple out some, good colostrum flow from Lt. Nipple, spot from Rt. #30 flang to Rt. #36 flang to Lt. For DEBP. #24 NS used on Rt. And 3ml colostrum inserted w/curve tip syring. Baby fussy, 20ml formula w/curve tip syring in NS. Reviewed and demonstrated application and comfort of NS and DEBP. Patient Name: Diamond Bolton [id number]'QToday's Date: 09/11/2013     Maternal Data    Feeding Feeding Type: Breast Fed Length of feed: 20 min  [facility]                      Lactation Tools Discussed/Used     Consult Status      Charyl DancerLaura G Artur Winningham 09/11/2013, 6:58 AM

## 2013-09-11 NOTE — Discharge Summary (Signed)
Obstetric Discharge Summary Reason for Admission: induction of labor Prenatal Procedures: none Intrapartum Procedures: cesarean: low cervical, transverse Postpartum Procedures: none Complications-Operative and Postpartum: none  Hospital Course: Diamond Bolton is a 23 y.o., G1P1001, 2571w4d who presented on 09/07/2013 for IOL d/t post-dates. Cytotec then pitocin was used; AROM was performed 09/08/2013. Shortly thereafter, the baby had repetitive variable and late decelerations with contractions despite decreasing and stopping pitocin. The patient was then taken to OR to have PLTCS; a viable female was delivered and admitted to the NICU "for observation and to rule out sepsis" per Dr. Joana Reameravanzo.  Today, Diamond Bolton is doing well, denies fevers, chills, N/V, dizziness, lightheadedness; tolerating PO, voiding, passing gas but no BM. Incision is clear, without bleeding or drainage. She is ambulating well and pain is well controlled. Patient is breastfeeding, plans on using Nexplanon for MOC.  Plan is d/c home today.  Delivery Note At 1:19 AM a viable female was delivered via C-Section, Low Transverse (Presentation: ;  ).  APGAR: 5, 8; weight 6 lb 11.6 oz (3050 g).   Placenta status: Intact, Spontaneous.  Cord: 3 vessels with the following complications: .  Cord pH: 7.255  Anesthesia: Epidural  Episiotomy: None Lacerations: None Suture Repair: vicryl Est. Blood Loss (mL): 900cc  Mom to AICU.  Baby to NICU.  Diamond Bolton 09/11/2013, 7:36 AM     H/H: Lab Results  Component Value Date/Time   HGB 10.1* 09/09/2013  7:25 AM   HCT 29.9* 09/09/2013  7:25 AM    Filed Vitals:   09/11/13 0556  BP: 126/73  Pulse: 90  Temp: 98 F (36.7 C)  Resp: 16    Physical Exam: VSS NAD Abd: Appropriately tender, ND, Fundus @U -firm Incision: clear, dry and intact No c/c/e, Neg homan's sign, neg cords Lochia Appropriate  Discharge Diagnoses: Term Pregnancy-delivered  Discharge Information: Date:  12/11/2010 Activity: pelvic rest Diet: routine  Medications: PNV, Motrin, Percocet Breast feeding:  Yes Condition: stable Instructions: refer to handout Discharge to: home      Medication List    ASK your doctor about these medications       prenatal multivitamin Tabs tablet  Take 1 tablet by mouth daily at 12 noon.         Diamond Bolton 09/11/2013,7:36 AM   I spoke with and examined patient and agree with PA-S's note and plan of care.  Diamond ScaleMichael Ryan Emaan Gary, MD Ob Fellow 09/11/2013 9:18 AM

## 2013-09-11 NOTE — Discharge Summary (Signed)
Attestation of Attending Supervision of Fellow: Evaluation and management procedures were performed by the Fellow under my supervision and collaboration.  I have reviewed the Fellow's note and chart, and I agree with the management and plan.    

## 2013-09-11 NOTE — Discharge Instructions (Signed)
Postpartum Care After Cesarean Delivery °After you deliver your newborn (postpartum period), the usual stay in the hospital is 24 72 hours. If there were problems with your labor or delivery, or if you have other medical problems, you might be in the hospital longer.  °While you are in the hospital, you will receive help and instructions on how to care for yourself and your newborn during the postpartum period.  °While you are in the hospital: °· It is normal for you to have pain or discomfort from the incision in your abdomen. Be sure to tell your nurses when you are having pain, where the pain is located, and what makes the pain worse. °· If you are breastfeeding, you may feel uncomfortable contractions of your uterus for a couple of weeks. This is normal. The contractions help your uterus get back to normal size. °· It is normal to have some bleeding after delivery. °· For the first 1 3 days after delivery, the flow is red and the amount may be similar to a period. °· It is common for the flow to start and stop. °· In the first few days, you may pass some small clots. Let your nurses know if you begin to pass large clots or your flow increases. °· Do not  flush blood clots down the toilet before having the nurse look at them. °· During the next 3 10 days after delivery, your flow should become more watery and pink or brown-tinged in color. °· Ten to fourteen days after delivery, your flow should be a small amount of yellowish-white discharge. °· The amount of your flow will decrease over the first few weeks after delivery. Your flow may stop in 6 8 weeks. Most women have had their flow stop by 12 weeks after delivery. °· You should change your sanitary pads frequently. °· Wash your hands thoroughly with soap and water for at least 20 seconds after changing pads, using the toilet, or before holding or feeding your newborn. °· Your intravenous (IV) tubing will be removed when you are drinking enough fluids. °· The  urine drainage tube (urinary catheter) that was inserted before delivery may be removed within 6 8 hours after delivery or when feeling returns to your legs. You should feel like you need to empty your bladder within the first 6 8 hours after the catheter has been removed. °· In case you become weak, lightheaded, or faint, call your nurse before you get out of bed for the first time and before you take a shower for the first time. °· Within the first few days after delivery, your breasts may begin to feel tender and full. This is called engorgement. Breast tenderness usually goes away within 48 72 hours after engorgement occurs. You may also notice milk leaking from your breasts. If you are not breastfeeding, do not stimulate your breasts. Breast stimulation can make your breasts produce more milk. °· Spending as much time as possible with your newborn is very important. During this time, you and your newborn can feel close and get to know each other. Having your newborn stay in your room (rooming in) will help to strengthen the bond with your newborn. It will give you time to get to know your newborn and become comfortable caring for your newborn. °· Your hormones change after delivery. Sometimes the hormone changes can temporarily cause you to feel sad or tearful. These feelings should not last more than a few days. If these feelings last longer   than that, you should talk to your caregiver. °· If desired, talk to your caregiver about methods of family planning or contraception. °· Talk to your caregiver about immunizations. Your caregiver may want you to have the following immunizations before leaving the hospital: °· Tetanus, diphtheria, and pertussis (Tdap) or tetanus and diphtheria (Td) immunization. It is very important that you and your family (including grandparents) or others caring for your newborn are up-to-date with the Tdap or Td immunizations. The Tdap or Td immunization can help protect your newborn  from getting ill. °· Rubella immunization. °· Varicella (chickenpox) immunization. °· Influenza immunization. You should receive this annual immunization if you did not receive the immunization during your pregnancy. °Document Released: 02/10/2012 Document Reviewed: 02/10/2012 °[employer]® Patient Information ©2014 [employer]. ° °

## 2013-09-14 ENCOUNTER — Ambulatory Visit (HOSPITAL_COMMUNITY)
Admission: RE | Admit: 2013-09-14 | Discharge: 2013-09-14 | Disposition: A | Discharge status: Home / Self Care | Payer: 59 | Source: Ambulatory Visit | Attending: Family Medicine | Admitting: Family Medicine

## 2013-09-14 NOTE — Lactation Note (Signed)
Adult Lactation Consultation Outpatient Visit Note  Patient Name: Diamond Bolton                                                                         "Diamond Bolton" Date of Birth: 1990-08-31                                                                                       365 days old Gestational Age at Delivery: 6223w4d                                                                     BW: 6-11 Type of Delivery:                                                                                                  todays weight: 7-4.2,3296  Breastfeeding History: Frequency of Breastfeeding: every 2-3 hours Mother attempts, no latch achieved Length of Feeding:  Voids: QS Stools: Qs  Supplementing / Method: Pumping:  Type of Pump:Symphomy(WIC loaner   Frequency: 3 times for 20 mins    Mother just got pump yesterday  Volume:  30 ml yesterday . She states she obtained 5 ounces this am  Comments:Infant was transferred to NICU at birth d/t respiratory distress. Infant return to room with mother piror to discharge. Infant was exclusively bottle feed while in the NICU. Mother has been unable to transition infant to breast. She has had multiple attempts without latch achieved Mother is very eager to exclusively breastfeed  infant.     Consultation Evaluation: Mother was fit with a #24 nipple shield. Infant was given a few ml of EBM with a curved tip syringe to entice infant to suckle. Infant sustained latch for 15 mins. Lots of teaching on proper support and positioning. Mother receptive to all teaching. Infant has a slight recessed chin.   Initial Feeding Assessment: Pre-feed [id number]:3296 Post-feed Weight:3306 Amount Transferred:10 ml Comments:  Additional Feeding Assessment:infant placed on alternate breast in football hold. Infant sustained latch for 15-20 mins. Mother taught breast compression and how to adjust infants lower and upper lips for wider gape.  Pre-feed Weight:3280 Post-feed  [id number]:3296 Amount Transferred:16 ml Comments:   Total Breast milk Transferred this Visit: 26 ml Total Supplement Given:  Grandmother to offer rest of feeding with a bottle. Recommend to offer infant at least 60 ml after each feeding.   Additional Interventions: Advised mother to continue to cue base feeding and at least every 2-3 hours. Mother to use a #24 nipple shield Supplement infant after each feeding using a bottle with a wide base and a slow flow nipple Give infant at least 60 ml after each feeding, more as needed. Infant has been taking 3- 3 1/2 ounces with each feeding Mother taught pace bottle feeding Suggest the mothe post pump at least 6 times daily for 20 mins Advised mother to continue to nap and drink to thirst Follow up in one week for feeding assessment     Follow-Up  April 23 at 10:30    [facility] Garron Eline 09/14/2013, 10:31 AM

## 2013-09-21 ENCOUNTER — Ambulatory Visit (HOSPITAL_COMMUNITY)
Admission: RE | Admit: 2013-09-21 | Discharge: 2013-09-21 | Disposition: A | Discharge status: Home / Self Care | Payer: 59 | Source: Ambulatory Visit | Attending: Family Medicine | Admitting: Family Medicine

## 2013-09-21 NOTE — Lactation Note (Signed)
Infant Lactation Consultation Outpatient Visit Note  Diamond Bolton has been BF using a NS.  He has attached a few times without it.  Mom is discouraged because he is still using the NS.  He receives about half of his feedings as formula (3-4 oz) in a bottle though she has enough BM for these feedings.  She is trying to save BM for when she returns to work.  Encouraged her to use as much BM as she can.  SHe is pumping about 4 times a day for for 30-40 " and expresses 3-4 oz.  Recommended adding more pumping frequent, shorter pumping sessions.    Consult:  Diamond Bolton does not open his mouth wide.  With much effort a deep comfortable latch was achieved but mother was not able to reproduce this.  Applied NS and he latched but mom had to flange his lips. There was milk visible in the NS.   Encouraged her to continue trying to attach him to the bare breast.  Also discussed "games" to play to encourage a gaping mouth.  Showed mom paced bottle feeding and explained that he needed a wide mouth with flanged lips on the bottle as well as the breast.  Much encouragement given to mother.  Smart start to weigh Monday because [facility] today [facility].  Mom said that he had a large stool [facility]  Follow-up with lactation in one Week.  Patient Name: Diamond DoctorBarbra J Boutelle [facility](Bobbi) Date of Birth: Dec 15, 1990 Birth Weight: 09/09/13  6+11.6   WIC weight 4/21 7+15 Gestational Age at Delivery: Gestational Age: <None> Type of Delivery: c-section  Breastfeeding History Frequency of Breastfeeding: 4 times in 24 hours Length of Feeding: about 30 minutes Voids: 6+ Stools: 10 yellow,seedy  Supplementing / Method: Pumping:  Type of Pump: lactina   Frequency:4-6 times in 24 hours for 30-40 minutes.  Encouraged more frequent pumping for a duration of about 20".  Volume:  3-4 ounces per session  Comments: Alternating BF and formula feeding (4 oz)    Consultation  Evaluation:  Initial Feeding Assessment: Pre-feed Weight:3506 Post-feed [id number]:3522 Amount Transferred:16 Comments: See note for latch detail.  Had eaten 3 ounces before the visit.     Total Breast milk Transferred this Visit:  Total Supplement Given:   Additional Interventions:   Follow-Up      Diamond Bolton 09/21/2013, 10:39 AM

## 2013-09-28 ENCOUNTER — Ambulatory Visit (HOSPITAL_COMMUNITY): Payer: 59

## 2014-03-06 ENCOUNTER — Emergency Department (HOSPITAL_COMMUNITY)
Admission: EM | Admit: 2014-03-06 | Discharge: 2014-03-06 | Disposition: A | Discharge status: Home / Self Care | Payer: 59 | Attending: Emergency Medicine | Admitting: Emergency Medicine

## 2014-03-06 ENCOUNTER — Encounter (HOSPITAL_COMMUNITY): Payer: Self-pay | Admitting: Emergency Medicine

## 2014-03-06 DIAGNOSIS — Z79899 Other long term (current) drug therapy: Secondary | ICD-10-CM | POA: Diagnosis not present

## 2014-03-06 DIAGNOSIS — N390 Urinary tract infection, site not specified: Secondary | ICD-10-CM | POA: Insufficient documentation

## 2014-03-06 DIAGNOSIS — J45909 Unspecified asthma, uncomplicated: Secondary | ICD-10-CM | POA: Diagnosis not present

## 2014-03-06 DIAGNOSIS — Z87891 Personal history of nicotine dependence: Secondary | ICD-10-CM | POA: Diagnosis not present

## 2014-03-06 DIAGNOSIS — Z3202 Encounter for pregnancy test, result negative: Secondary | ICD-10-CM | POA: Diagnosis not present

## 2014-03-06 DIAGNOSIS — R109 Unspecified abdominal pain: Secondary | ICD-10-CM | POA: Diagnosis present

## 2014-03-06 LAB — COMPREHENSIVE METABOLIC PANEL
ALT: 19 U/L (ref 0–35)
AST: 14 U/L (ref 0–37)
Albumin: 4 g/dL (ref 3.5–5.2)
Alkaline Phosphatase: 57 U/L (ref 39–117)
Anion gap: 14 (ref 5–15)
BUN: 10 mg/dL (ref 6–23)
CO2: 21 mEq/L (ref 19–32)
Calcium: 9.3 mg/dL (ref 8.4–10.5)
Chloride: 102 mEq/L (ref 96–112)
Creatinine, Ser: 0.93 mg/dL (ref 0.50–1.10)
GFR calc Af Amer: >90 mL/min (ref >90)
GFR calc non Af Amer: 87 mL/min — ABNORMAL LOW (ref >90)
Glucose, Bld: 140 mg/dL — ABNORMAL HIGH (ref 70–99)
Potassium: 3.3 mEq/L — ABNORMAL LOW (ref 3.7–5.3)
Sodium: 137 mEq/L (ref 137–147)
Total Bilirubin: 0.4 mg/dL (ref 0.3–1.2)
Total Protein: 7.4 g/dL (ref 6.0–8.3)

## 2014-03-06 LAB — CBC WITH DIFFERENTIAL/PLATELET
Basophils Absolute: 0 10*3/uL (ref 0.0–0.1)
Basophils Relative: 0 % (ref 0–1)
Eosinophils Absolute: 0.1 10*3/uL (ref 0.0–0.7)
Eosinophils Relative: 1 % (ref 0–5)
HCT: 38 % (ref 36.0–46.0)
Hemoglobin: 13 g/dL (ref 12.0–15.0)
Lymphocytes Relative: 32 % (ref 12–46)
Lymphs Abs: 3 10*3/uL (ref 0.7–4.0)
MCH: 28.5 pg (ref 26.0–34.0)
MCHC: 34.2 g/dL (ref 30.0–36.0)
MCV: 83.3 fL (ref 78.0–100.0)
Monocytes Absolute: 0.6 10*3/uL (ref 0.1–1.0)
Monocytes Relative: 6 % (ref 3–12)
Neutro Abs: 5.5 10*3/uL (ref 1.7–7.7)
Neutrophils Relative %: 61 % (ref 43–77)
Platelets: 312 10*3/uL (ref 150–400)
RBC: 4.56 MIL/uL (ref 3.87–5.11)
RDW: 13.7 % (ref 11.5–15.5)
WBC: 9.2 10*3/uL (ref 4.0–10.5)

## 2014-03-06 LAB — URINALYSIS, ROUTINE W REFLEX MICROSCOPIC
Bilirubin Urine: NEGATIVE
Glucose, UA: NEGATIVE mg/dL
Hgb urine dipstick: NEGATIVE
Ketones, ur: 15 mg/dL — AB
Nitrite: NEGATIVE
Protein, ur: NEGATIVE mg/dL
Specific Gravity, Urine: 1.019 (ref 1.005–1.030)
Urobilinogen, UA: 0.2 mg/dL (ref 0.0–1.0)
pH: 5.5 (ref 5.0–8.0)

## 2014-03-06 LAB — URINE MICROSCOPIC-ADD ON

## 2014-03-06 LAB — PREGNANCY, URINE: Preg Test, Ur: NEGATIVE

## 2014-03-06 MED ORDER — TRAMADOL HCL 50 MG PO TABS: 50.0000 mg | ORAL_TABLET | Freq: Four times a day (QID) | ORAL | Status: DC | PRN

## 2014-03-06 MED ORDER — ONDANSETRON HCL 4 MG/2ML IJ SOLN
4.0000 mg | Freq: Once | INTRAMUSCULAR | Status: AC
  Administered 2014-03-06: 4 mg via INTRAVENOUS
  Filled 2014-03-06: qty 2

## 2014-03-06 MED ORDER — KETOROLAC TROMETHAMINE 15 MG/ML IJ SOLN
15.0000 mg | Freq: Once | INTRAMUSCULAR | Status: AC
  Administered 2014-03-06: 15 mg via INTRAVENOUS
  Filled 2014-03-06: qty 1

## 2014-03-06 MED ORDER — CEPHALEXIN 500 MG PO CAPS: 500.0000 mg | ORAL_CAPSULE | Freq: Four times a day (QID) | ORAL | Status: DC

## 2014-03-06 MED ORDER — HYDROMORPHONE HCL 1 MG/ML IJ SOLN
1.0000 mg | Freq: Once | INTRAMUSCULAR | Status: AC
  Administered 2014-03-06: 1 mg via INTRAVENOUS
  Filled 2014-03-06: qty 1

## 2014-03-06 MED ORDER — SODIUM CHLORIDE 0.9 % IV BOLUS (SEPSIS)
1000.0000 mL | Freq: Once | INTRAVENOUS | Status: AC
  Administered 2014-03-06: 1000 mL via INTRAVENOUS

## 2014-03-06 MED ORDER — DEXTROSE 5 % IV SOLN
1.0000 g | Freq: Once | INTRAVENOUS | Status: AC
  Administered 2014-03-06: 1 g via INTRAVENOUS
  Filled 2014-03-06: qty 10

## 2014-03-06 NOTE — Discharge Instructions (Signed)

## 2014-03-06 NOTE — ED Notes (Signed)
Pt. reports persistent right abdominal pain for 1 1/2 weeks with diarrhea , denies nausea or vomitting , no fever or chills. No urinary discomfort .

## 2014-03-06 NOTE — ED Notes (Signed)
Pt has not had a menstrual period since May 2015 because she had the Implanon implanted into left arm.

## 2014-03-07 LAB — URINE CULTURE: Colony Count: >=100000

## 2014-03-14 NOTE — ED Provider Notes (Signed)
CSN: [id number]     Arrival date & time 03/06/14  0227 History   First MD Initiated Contact with Patient 03/06/14 0356     Chief Complaint  Patient presents with  . Abdominal Pain     (Consider location/radiation/quality/duration/timing/severity/associated sxs/prior Treatment) HPI  23 year old female with abdominal pain. Symptom onset about a week and a half ago. Waxes and wanes. Associated with diarrhea. No nausea vomiting. No fevers or chills. No unusual vaginal discharge or bleeding. She does not think she is pregnant. No urinary complaints. No sick contacts. No anorexia. She has not noticed anything that seems to appreciably exacerbate or relieve her symptoms. Pain is primarily in the right side but will occasionally have some periumbilically and suprapubically.  Past Medical History  Diagnosis Date  . Medical history non-contributory   . Asthma    Past Surgical History  Procedure Laterality Date  . No past surgeries    . Tonsillectomy    . Addenoidectomy    . Wisdom tooth extraction    . Cesarean section N/A 09/09/2013    Procedure: CESAREAN SECTION;  Surgeon: Levie HeritageJacob J Stinson, DO;  Location: [facility];  Service: Gynecology;  Laterality: N/A;   Family History  Problem Relation Age of Onset  . Thyroid disease Mother   . Varicose Veins Mother   . Diabetes Sister   . Varicose Veins Sister   . Cancer Maternal Grandmother     breast  . Hypertension Maternal Aunt   . Hypertension Maternal Uncle   . Cancer Maternal Grandfather     lung and brain   History  Substance Use Topics  . Smoking status: Former Smoker -- 0.50 packs/day for 2 years    Types: Cigarettes    Quit date: 01/11/2013  . Smokeless tobacco: Never Used  . Alcohol Use: No     Comment: not while pregnant   OB History   Grav Para Term Preterm Abortions TAB SAB Ect Mult Living   1 1 1       1      Review of Systems  All systems reviewed and negative, other than as noted in HPI.   Allergies  Review of  patient's allergies indicates no known allergies.  Home Medications   Prior to Admission medications   Medication Sig Start Date End Date Taking? Authorizing Provider  Biotin (BIOTIN 5000) 5 MG CAPS Take 5 mg by mouth daily.   Yes Historical Provider, MD  etonogestrel (NEXPLANON) 68 MG IMPL implant Inject 1 each into the skin once.   Yes Historical Provider, MD  OVER THE COUNTER MEDICATION Take 2 tablets by mouth daily. Lean body for her.  Food based multi vitamin   Yes Historical Provider, MD  cephALEXin (KEFLEX) 500 MG capsule Take 1 capsule (500 mg total) by mouth 4 (four) times daily. 03/06/14   Raeford RazorStephen Tarris Delbene, MD  traMADol (ULTRAM) 50 MG tablet Take 1 tablet (50 mg total) by mouth every 6 (six) hours as needed. 03/06/14   Raeford RazorStephen Navil Kole, MD   BP 96/69  Pulse 70  Temp(Src) 98.5 F (36.9 C) (Oral)  Resp 20  Ht 5\' 5"  (1.651 m)  Wt 195 lb (88.451 kg)  BMI 32.45 kg/m2  SpO2 100% Physical Exam  Nursing note and vitals reviewed. Constitutional: She appears well-developed and well-nourished. No distress.  HENT:  Head: Normocephalic and atraumatic.  Eyes: Conjunctivae are normal. Right eye exhibits no discharge. Left eye exhibits no discharge.  Neck: Neck supple.  Cardiovascular: Normal rate, regular rhythm and normal  heart sounds.  Exam reveals no gallop and no friction rub.   No murmur heard. Pulmonary/Chest: Effort normal and breath sounds normal. No respiratory distress.  Abdominal: Soft. She exhibits no distension. There is no tenderness.  Abdominal exam is pretty benign. She perhaps has some mild suprapubic tenderness. While talking to her, repeat palpation in the same area does not seem to elicit any response though. She is nontender elsewhere. She is no rebound or guarding. No distention.  Genitourinary:  No cva tenderness   Musculoskeletal: She exhibits no edema and no tenderness.  Neurological: She is alert.  Skin: Skin is warm and dry.  Psychiatric: She has a normal mood and  affect. Her behavior is normal. Thought content normal.    ED Course  Procedures (including critical care time) Labs Review Labs Reviewed  COMPREHENSIVE METABOLIC PANEL - Abnormal; Notable for the following:    Potassium 3.3 (*)    Glucose, Bld 140 (*)    GFR calc non Af Amer 87 (*)    All other components within normal limits  URINALYSIS, ROUTINE W REFLEX MICROSCOPIC - Abnormal; Notable for the following:    APPearance CLOUDY (*)    Ketones, ur 15 (*)    Leukocytes, UA LARGE (*)    All other components within normal limits  URINE MICROSCOPIC-ADD ON - Abnormal; Notable for the following:    Squamous Epithelial / LPF MANY (*)    Bacteria, UA MANY (*)    All other components within normal limits  URINE CULTURE  CBC WITH DIFFERENTIAL  PREGNANCY, URINE    Imaging Review No results found.   EKG Interpretation None      MDM   Final diagnoses:  UTI (lower urinary tract infection)    23 year old female with abdominal pain. Exam is pretty unremarkable. May potentially be from urinary tract infection, although she has no specific urinary complaints. Workup has been otherwise pretty unremarkable. Doubt acute surgical process. Her abdominal exam is reassuring. She is actually stable. We'll chief symptoms as well as for UTI. Return precautions discussed.    Raeford RazorStephen Skarleth Delmonico, MD 03/14/14 [phone]

## 2014-03-19 ENCOUNTER — Other Ambulatory Visit (HOSPITAL_COMMUNITY)
Admission: RE | Admit: 2014-03-19 | Discharge: 2014-03-19 | Disposition: A | Discharge status: Home / Self Care | Payer: 59 | Source: Ambulatory Visit | Attending: Family Medicine | Admitting: Family Medicine

## 2014-03-19 ENCOUNTER — Other Ambulatory Visit: Payer: Self-pay | Admitting: Physician Assistant

## 2014-03-19 DIAGNOSIS — Z124 Encounter for screening for malignant neoplasm of cervix: Secondary | ICD-10-CM | POA: Diagnosis not present

## 2014-03-20 LAB — CYTOLOGY - PAP

## 2014-04-02 ENCOUNTER — Encounter (HOSPITAL_COMMUNITY): Payer: Self-pay | Admitting: Emergency Medicine

## 2014-09-18 HISTORY — PX: ANKLE FRACTURE SURGERY: SHX122

## 2015-01-14 IMAGING — US US OB COMP LESS 14 WK
1 series · 14 of 28 positions shown · non-contrast
Comparison: None.

CLINICAL DATA: Abdominal pain in early pregnancy.

OBSTETRIC <14 WK US AND TRANSVAGINAL OB US
TECHNIQUE: Both transabdominal and transvaginal ultrasound
examinations were performed for complete evaluation of the
gestation as well as the maternal uterus, adnexal regions, and
pelvic cul-de-sac.  Transvaginal technique was performed to assess
early pregnancy.

[Series 1: us ob comp less 14 wks · 14 of 40 slices shown]
[im 2/40]
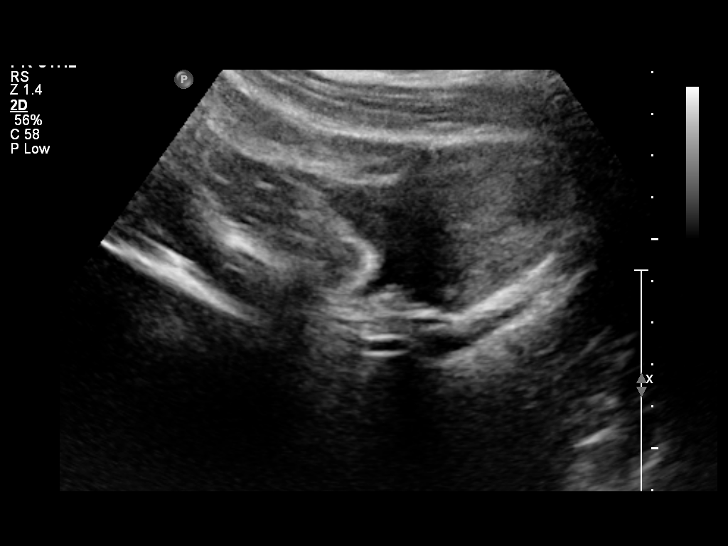
[im 5/40]
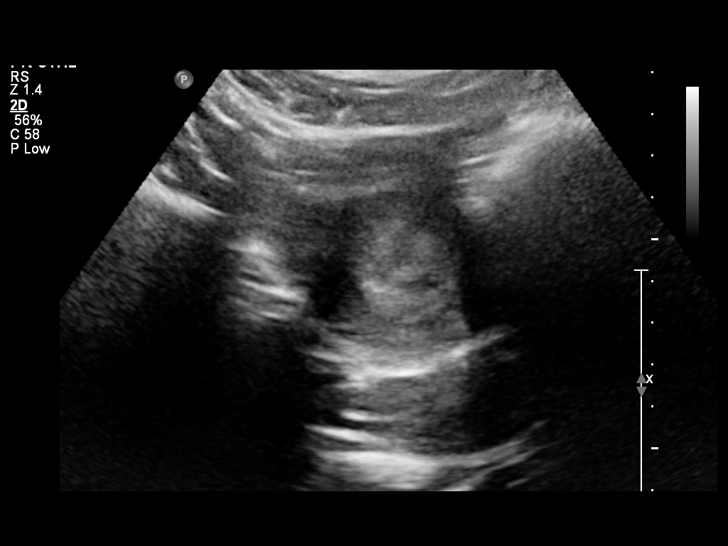
[im 8/40]
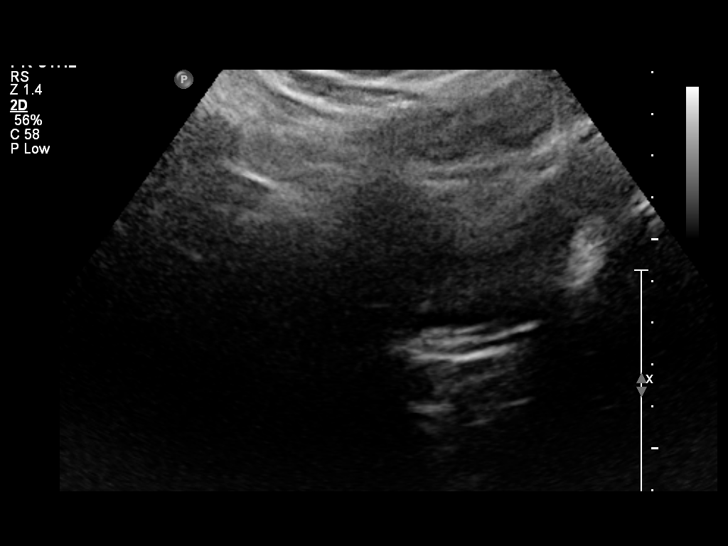
[im 11/40]
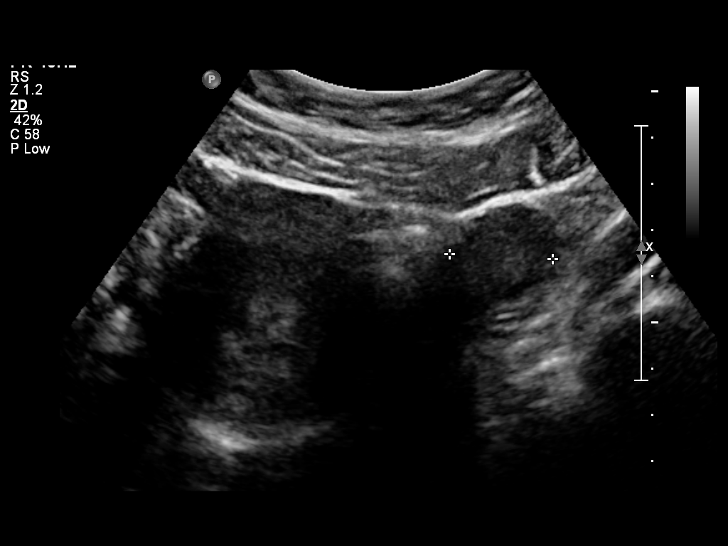
[im 14/40]
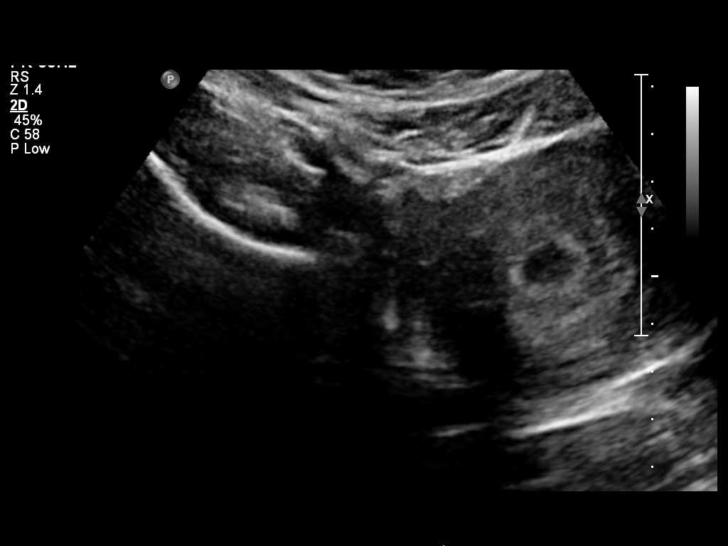
[im 16/40]
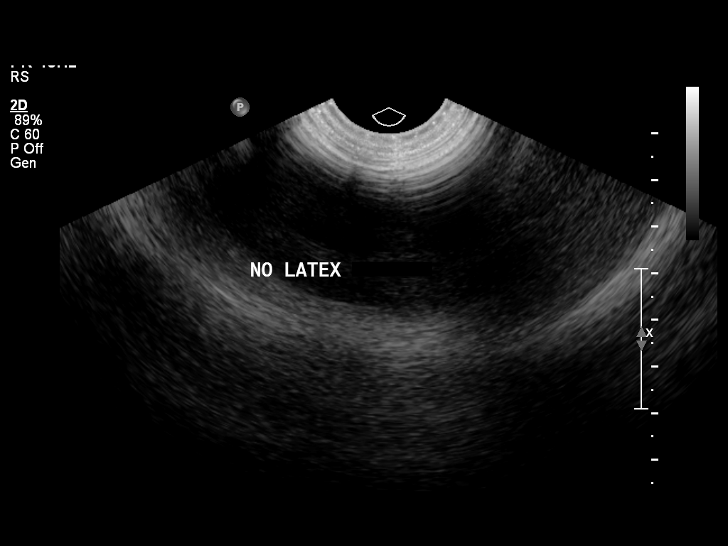
[im 19/40]
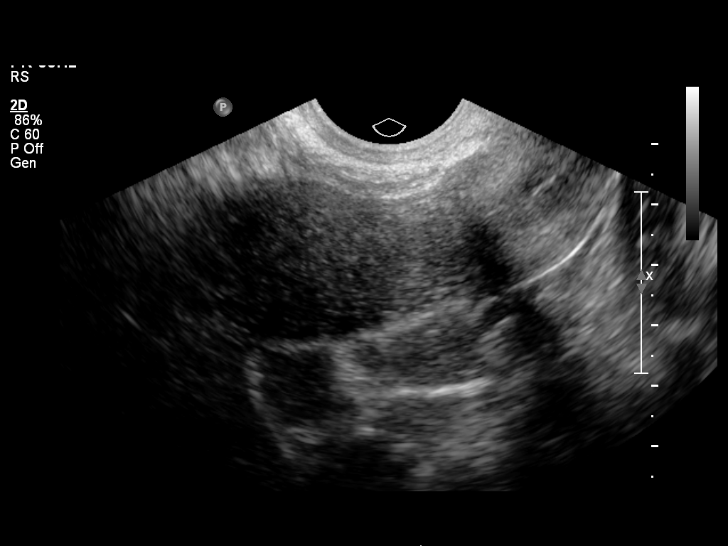
[im 22/40]
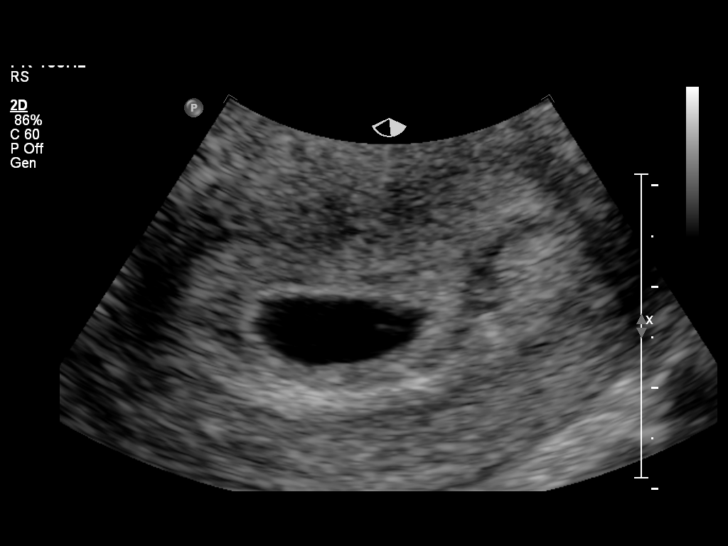
[im 25/40]
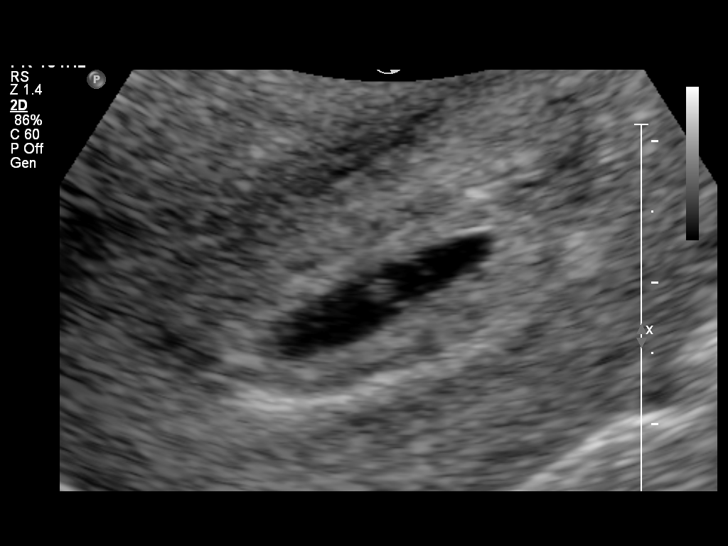
[im 28/40]
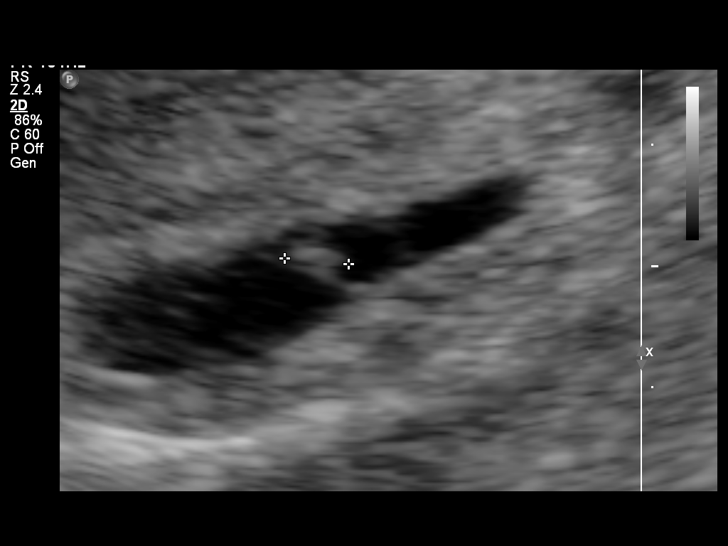
[im 31/40]
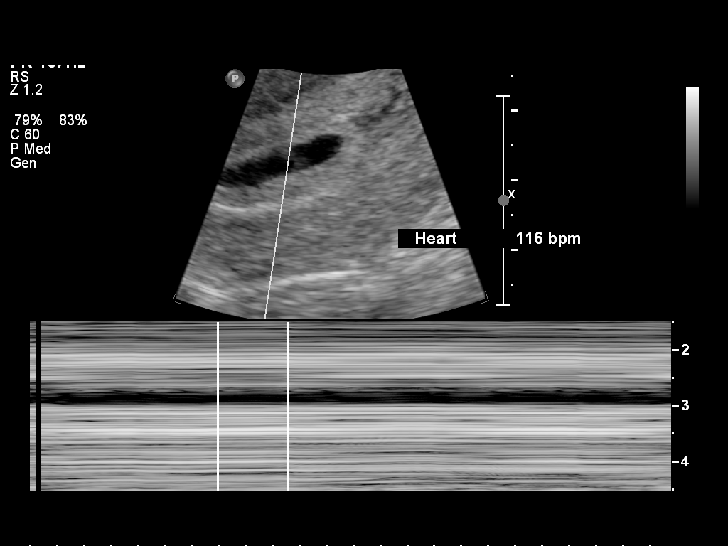
[im 34/40]
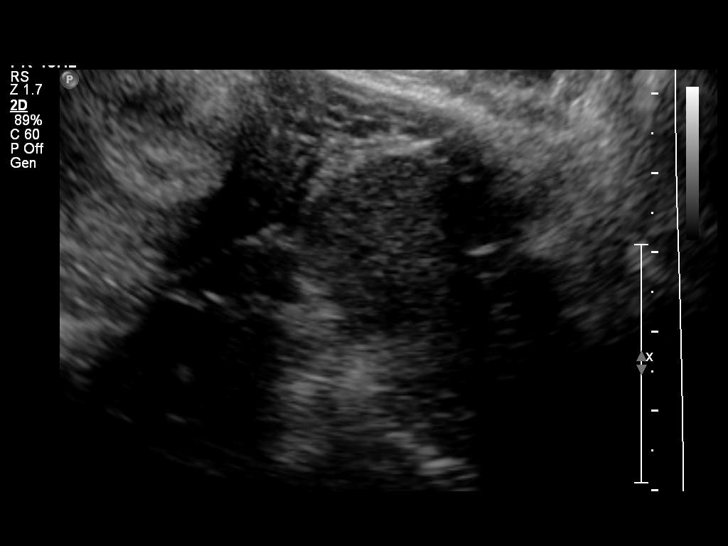
[im 37/40]
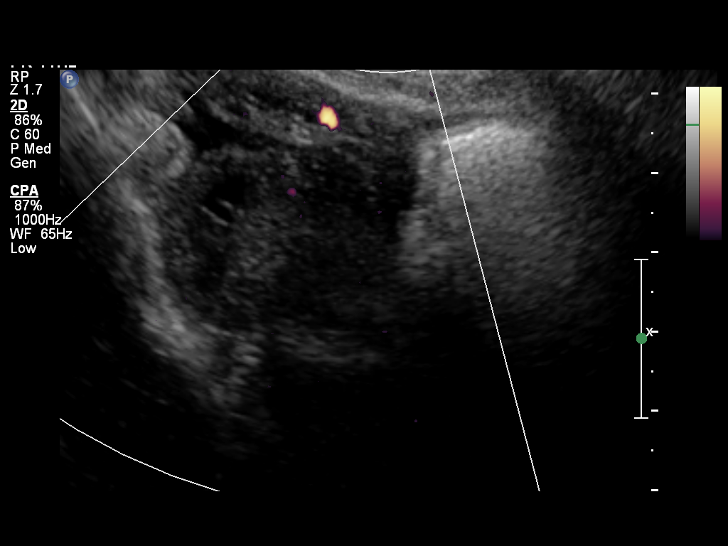
[im 40/40]
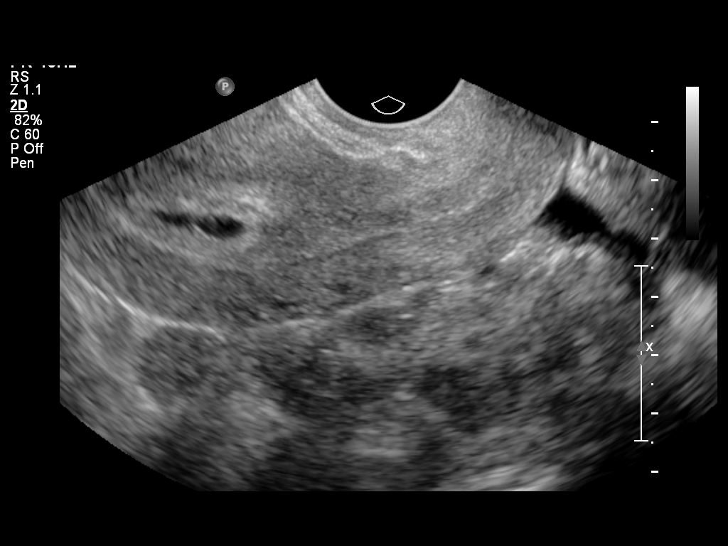

[14 of 28 positions shown; findings below may reference images not displayed]

Intrauterine gestational sac:  Single
Yolk sac: Yes
Embryo: Yes
Cardiac Activity: Yes
Heart Rate: 116 bpm

CRL: 2.8  mm  6 w  0 d        US EDC: 08/28/2013

Maternal uterus/adnexae:
There is no subchorionic hemorrhage.  The ovaries appear normal.
There is a trace of free fluid in the pelvis.
IMPRESSION: Normal appearing single intrauterine pregnancy of approximately 6
weeks and 0 days gestation.

## 2017-06-22 ENCOUNTER — Ambulatory Visit: Payer: Self-pay | Admitting: Obstetrics and Gynecology

## 2017-07-26 ENCOUNTER — Ambulatory Visit: Payer: 59 | Admitting: Allergy

## 2017-08-03 ENCOUNTER — Ambulatory Visit (HOSPITAL_COMMUNITY)
Admission: EM | Admit: 2017-08-03 | Discharge: 2017-08-03 | Disposition: A | Discharge status: Home / Self Care | Payer: 59 | Attending: Family Medicine | Admitting: Family Medicine

## 2017-08-03 ENCOUNTER — Encounter (HOSPITAL_COMMUNITY): Payer: Self-pay | Admitting: Emergency Medicine

## 2017-08-03 DIAGNOSIS — R1013 Epigastric pain: Secondary | ICD-10-CM

## 2017-08-03 DIAGNOSIS — K529 Noninfective gastroenteritis and colitis, unspecified: Secondary | ICD-10-CM

## 2017-08-03 DIAGNOSIS — Z3202 Encounter for pregnancy test, result negative: Secondary | ICD-10-CM

## 2017-08-03 LAB — POCT URINALYSIS DIP (DEVICE)
Bilirubin Urine: NEGATIVE
GLUCOSE, UA: NEGATIVE mg/dL
Hgb urine dipstick: NEGATIVE
Leukocytes, UA: NEGATIVE
Nitrite: NEGATIVE
PROTEIN: NEGATIVE mg/dL
Specific Gravity, Urine: >=1.03 (ref 1.005–1.030)
Urobilinogen, UA: 0.2 mg/dL (ref 0.0–1.0)
pH: 6 (ref 5.0–8.0)

## 2017-08-03 LAB — POCT PREGNANCY, URINE: Preg Test, Ur: NEGATIVE

## 2017-08-03 MED ORDER — ONDANSETRON 4 MG PO TBDP
ORAL_TABLET | ORAL | Status: AC
  Filled 2017-08-03: qty 1

## 2017-08-03 MED ORDER — KETOROLAC TROMETHAMINE 60 MG/2ML IM SOLN
60.0000 mg | Freq: Once | INTRAMUSCULAR | Status: AC
  Administered 2017-08-03: 60 mg via INTRAMUSCULAR

## 2017-08-03 MED ORDER — ONDANSETRON HCL 4 MG PO TABS: 4.0000 mg | ORAL_TABLET | Freq: Three times a day (TID) | ORAL | 0 refills | Status: DC | PRN

## 2017-08-03 MED ORDER — ONDANSETRON 4 MG PO TBDP
4.0000 mg | ORAL_TABLET | Freq: Once | ORAL | Status: AC
  Administered 2017-08-03: 4 mg via ORAL

## 2017-08-03 MED ORDER — KETOROLAC TROMETHAMINE 60 MG/2ML IM SOLN
INTRAMUSCULAR | Status: AC
  Filled 2017-08-03: qty 2

## 2017-08-03 NOTE — ED Triage Notes (Signed)
PT reports abdominal pain, diarrhea, and vomiting that started at 7am. PT reports 3x vomiting and more than 6x diarrhea. PT reports pain is in epigastric area.

## 2017-08-03 NOTE — Discharge Instructions (Signed)
Liquid diet until symptoms improve. Small frequent sips of fluids, pedialyte, gatorade, soups or broths. Zofran every 8 hours as needed for nausea. Tylenol as needed for pain. I would expect symptoms to improve in the next 2-5 days. If worsening of pain, fevers, blood to diarrhea or vomiting or otherwise worsening please return to be seen or go to Er.

## 2017-08-03 NOTE — ED Provider Notes (Signed)
[facility]    CSN: [id number] Arrival date & time: 08/03/17  1652     History   Chief Complaint Chief Complaint  Patient presents with  . Emesis  . Diarrhea    HPI Diamond Bolton is a 27 y.o. female.   Diamond Bolton presents with complaints of sudden onset this morning with abdominal pain, vomiting and diarrhea. She has vomited three times today, last episode approximately 1 hour pta. Diarrhea is watery. No known blood. Generalized abdominal pain. No known ill contacts. Denies eating any contaminated food or recent travel. Urinating. Mild headache and dizziness. States feels similar to stomach virus' she has had as a child. Denies any recent similar. c section in past but otherwise without previous abdominal surgery.    ROS per HPI.       Past Medical History:  Diagnosis Date  . Asthma   . Medical history non-contributory     Patient Active Problem List   Diagnosis Date Noted  . S/P cesarean section 09/09/2013  . Personal history of previous postdates pregnancy 09/07/2013    Past Surgical History:  Procedure Laterality Date  . addenoidectomy    . CESAREAN SECTION N/A 09/09/2013   Procedure: CESAREAN SECTION;  Surgeon: Levie Heritage, DO;  Location: [facility];  Service: Gynecology;  Laterality: N/A;  . NO PAST SURGERIES    . TONSILLECTOMY    . WISDOM TOOTH EXTRACTION      OB History    Gravida Para Term Preterm AB Living   1 1 1     1    SAB TAB Ectopic Multiple Live Births           1       Home Medications    Prior to Admission medications   Medication Sig Start Date End Date Taking? Authorizing Provider  diphenhydrAMINE (BENADRYL) 25 MG tablet Take 25 mg by mouth every 6 (six) hours as needed.   Yes [provider]  etonogestrel (NEXPLANON) 68 MG IMPL implant Inject 1 each into the skin once.   Yes [provider]  Biotin (BIOTIN 5000) 5 MG CAPS Take 5 mg by mouth daily.    [provider]  cephALEXin (KEFLEX) 500 MG  capsule Take 1 capsule (500 mg total) by mouth 4 (four) times daily. 03/06/14   Raeford Razor, MD  ondansetron (ZOFRAN) 4 MG tablet Take 1 tablet (4 mg total) by mouth every 8 (eight) hours as needed for nausea or vomiting. 08/03/17   Georgetta Haber, NP  OVER THE COUNTER MEDICATION Take 2 tablets by mouth daily. Lean body for her.  Food based multi vitamin    [provider]  traMADol (ULTRAM) 50 MG tablet Take 1 tablet (50 mg total) by mouth every 6 (six) hours as needed. 03/06/14   Raeford Razor, MD    Family History Family History  Problem Relation Age of Onset  . Thyroid disease Mother   . Varicose Veins Mother   . Diabetes Sister   . Varicose Veins Sister   . Cancer Maternal Grandmother        breast  . Hypertension Maternal Aunt   . Hypertension Maternal Uncle   . Cancer Maternal Grandfather        lung and brain    Social History Social History   Tobacco Use  . Smoking status: Former Smoker    Packs/day: 0.50    Years: 2.00    Pack years: 1.00    Types: Cigarettes  Last attempt to quit: 01/11/2013    Years since quitting: 4.5  . Smokeless tobacco: Never Used  Substance Use Topics  . Alcohol use: No    Comment: not while pregnant  . Drug use: Yes    Types: Marijuana    Comment: last use summer 2014     Allergies   Patient has no known allergies.   Review of Systems Review of Systems   Physical Exam Triage Vital Signs ED Triage Vitals  Enc Vitals Group     BP 08/03/17 1714 109/67     Pulse Rate 08/03/17 1714 88     Resp 08/03/17 1713 16     Temp 08/03/17 1713 97.6 F (36.4 C)     Temp Source 08/03/17 1713 Oral     SpO2 08/03/17 1714 100 %     Weight 08/03/17 1714 220 lb (99.8 kg)     Height --      Head Circumference --      Peak Flow --      Pain Score 08/03/17 1713 6     Pain Loc --      Pain Edu? --      Excl. in GC? --    No data found.  Updated Vital Signs BP 109/67   Pulse 88   Temp 97.6 F (36.4 C) (Oral)   Resp 16    Wt 220 lb (99.8 kg)   LMP 07/06/2017   SpO2 100%   BMI 36.61 kg/m   Visual Acuity Right Eye Distance:   Left Eye Distance:   Bilateral Distance:    Right Eye Near:   Left Eye Near:    Bilateral Near:     Physical Exam  Constitutional: She is oriented to person, place, and time. She appears well-developed and well-nourished. No distress.  Cardiovascular: Normal rate, regular rhythm and normal heart sounds.  Pulmonary/Chest: Effort normal and breath sounds normal.  Abdominal: Soft. There is generalized tenderness and tenderness in the epigastric area and periumbilical area. There is no rigidity, no rebound, no guarding and no CVA tenderness.  Neurological: She is alert and oriented to person, place, and time.  Skin: Skin is warm and dry.     UC Treatments / Results  Labs (all labs ordered are listed, but only abnormal results are displayed) Labs Reviewed  POCT URINALYSIS DIP (DEVICE) - Abnormal; Notable for the following components:      Result Value   Ketones, ur TRACE (*)    All other components within normal limits  POCT PREGNANCY, URINE    EKG  EKG Interpretation None       Radiology No results found.  Procedures Procedures (including critical care time)  Medications Ordered in UC Medications  ondansetron (ZOFRAN-ODT) disintegrating tablet 4 mg (4 mg Oral Given 08/03/17 1752)  ketorolac (TORADOL) injection 60 mg (60 mg Intramuscular Given 08/03/17 1817)     Initial Impression / Assessment and Plan / UC Course  I have reviewed the triage vital signs and the nursing notes.  Pertinent labs & imaging results that were available during my care of the patient were reviewed by me and considered in my medical decision making (see chart for details).     Non specific abdominal exam. Trace ketones to urine. Patient is taking some fluids PO in clinic s/p zofran administration. Vitals stable at this time. Agreeable to discharge. Return precautions provided. Patient  verbalized understanding and agreeable to plan.    Final Clinical Impressions(s) / UC Diagnoses  Final diagnoses:  Gastroenteritis    ED Discharge Orders        Ordered    ondansetron (ZOFRAN) 4 MG tablet  Every 8 hours PRN     08/03/17 1802       Controlled Substance Prescriptions [place] Controlled Substance Registry consulted? Not Applicable   Georgetta Haber, NP 08/03/17 [phone]

## 2017-08-11 ENCOUNTER — Ambulatory Visit: Payer: 59 | Admitting: Allergy & Immunology

## 2017-09-02 ENCOUNTER — Ambulatory Visit (INDEPENDENT_AMBULATORY_CARE_PROVIDER_SITE_OTHER): Payer: 59 | Admitting: Allergy

## 2017-09-02 ENCOUNTER — Encounter: Payer: Self-pay | Admitting: Allergy

## 2017-09-02 VITALS — BP 128/82 | HR 84 | Temp 98.6°F | Resp 20 | Ht 65.75 in | Wt 248.0 lb

## 2017-09-02 DIAGNOSIS — H109 Unspecified conjunctivitis: Secondary | ICD-10-CM | POA: Diagnosis not present

## 2017-09-02 DIAGNOSIS — J310 Chronic rhinitis: Secondary | ICD-10-CM

## 2017-09-02 DIAGNOSIS — L508 Other urticaria: Secondary | ICD-10-CM | POA: Diagnosis not present

## 2017-09-02 MED ORDER — OLOPATADINE HCL 0.2 % OP SOLN: 1.0000 [drp] | Freq: Every day | OPHTHALMIC | 5 refills | Status: AC | PRN

## 2017-09-02 MED ORDER — RANITIDINE HCL 150 MG PO TABS
150.0000 mg | ORAL_TABLET | Freq: Two times a day (BID) | ORAL | 5 refills | Status: AC
Start: 2017-09-02 — End: ?

## 2017-09-02 MED ORDER — MONTELUKAST SODIUM 10 MG PO TABS: 10.0000 mg | ORAL_TABLET | Freq: Every day | ORAL | 5 refills | Status: AC

## 2017-09-02 MED ORDER — CETIRIZINE HCL 10 MG PO TABS: 10.0000 mg | ORAL_TABLET | Freq: Every day | ORAL | 5 refills | Status: AC

## 2017-09-02 NOTE — Progress Notes (Signed)
New Patient Note  RE: Diamond Bolton MRN: [id number] DOB: [birth year] Date of Office Visit: 09/02/2017  Referring provider: No ref. provider found  Primary care provider: No primary care provider on file.  Chief Complaint: hives  History of present illness: Diamond Bolton is a 27 y.o. female presenting today for consultation for hives.    She has been having hives for the past year.  She states for the past 6 months they have been worsening.  She states she is miserable with itching all over.  She states she has to take an allergy pill to help control the hives (she has been taking benadryl).  She also tried topical benadryl which did not help at all.  She describes the hives as welts and itchy.  Hives last about a hour at a time before it resolves.  Does not leave any marks/bruising.  No joint aches/pains and no fevers with hives.  She denies any physical urticaria components.  She feels that dust is a trigger of hives as they appear a lot at work which she states is a dusty environment.  She is an International aid/development worker at [employer]   She does report sneezing and itchy eyes which can occur year-round.  She will take benadryl to help relieve these symptoms.      She has history of childhood asthma but has not had any symptoms in adulthood and does not have any inhalers.   No history of eczema or food allergy.      Review of systems: Review of Systems  Constitutional: Negative for chills, fever and malaise/fatigue.  HENT: Negative for congestion, ear discharge, ear pain, nosebleeds, sinus pain and sore throat.   Eyes: Negative for pain, discharge and redness.  Respiratory: Negative for cough, shortness of breath and wheezing.   Cardiovascular: Negative for chest pain.  Gastrointestinal: Negative for abdominal pain, constipation, diarrhea, heartburn, nausea and vomiting.  Musculoskeletal: Negative for joint pain.  Skin: Positive for itching and rash.  Neurological: Negative  for headaches.    All other systems negative unless noted above in HPI  Past medical history: Past Medical History:  Diagnosis Date  . Asthma   . Urticaria     Past surgical history: Past Surgical History:  Procedure Laterality Date  . addenoidectomy    . ANKLE FRACTURE SURGERY Right 09/18/2014  . CESAREAN SECTION N/A 09/09/2013   Procedure: CESAREAN SECTION;  Surgeon: Levie Heritage, DO;  Location: [facility];  Service: Gynecology;  Laterality: N/A;  . NO PAST SURGERIES    . TONSILLECTOMY    . WISDOM TOOTH EXTRACTION      Family history:  Family History  Problem Relation Age of Onset  . Thyroid disease Mother   . Varicose Veins Mother   . Diabetes Sister   . Varicose Veins Sister   . Cancer Maternal Grandmother        breast  . Hypertension Maternal Aunt   . Hypertension Maternal Uncle   . Cancer Maternal Grandfather        lung and brain    Social history: She lives in a home with carpeting with electric heating and central cooling.  No pets in the home.  No concern for water damage, milew or roaches in the home.   Tobacco Use  . Smoking status: Current Every Day Smoker    Packs/day: 0.25    Years: 5.00    Pack years: 1.25    Types: Cigarettes    Last  attempt to quit: 01/11/2013    Years since quitting: 4.6  . Smokeless tobacco: Never Used    Medication List: Allergies as of 09/02/2017   No Known Allergies     Medication List        Accurate as of 09/02/17  4:35 PM. Always use your most recent med list.          cetirizine 10 MG tablet Commonly known as:  ZYRTEC Take 1 tablet (10 mg total) by mouth daily.   diphenhydrAMINE 25 MG tablet Commonly known as:  BENADRYL Take 25 mg by mouth every 6 (six) hours as needed.   montelukast 10 MG tablet Commonly known as:  SINGULAIR Take 1 tablet (10 mg total) by mouth at bedtime.   NEXPLANON 68 MG Impl implant Generic drug:  etonogestrel Inject 1 each into the skin once.   Olopatadine HCl 0.2 %  Soln Commonly known as:  PATADAY Place 1 drop into both eyes daily as needed.   ranitidine 150 MG tablet Commonly known as:  ZANTAC Take 1 tablet (150 mg total) by mouth 2 (two) times daily.       Known medication allergies: No Known Allergies   Physical examination: Blood pressure 128/82, pulse 84, temperature 98.6 F (37 C), temperature source Tympanic, resp. rate 20, height 5' 5.75" (1.67 m), weight 248 lb (112.5 kg), unknown if currently breastfeeding.  General: Alert, interactive, in no acute distress. HEENT: PERRLA, TMs pearly gray, turbinates non-edematous without discharge, post-pharynx non erythematous. Neck: Supple without lymphadenopathy. Lungs: Clear to auscultation without wheezing, rhonchi or rales. {no increased work of breathing. CV: Normal S1, S2 without murmurs. Abdomen: Nondistended, nontender. Skin: Scattered erythematous urticarial type lesions primarily located arrms and neck.   +dermatographism Extremities:  No clubbing, cyanosis or edema. Neuro:   Grossly intact.  Diagnositics/Labs:  Allergy testing: deferred due to ongoing urticaria and dermatographism   Assessment and plan: Chronic urticaria  - at this time etiology of hives is unknown.  Hives can be caused by a variety of different triggers including illness/infection, foods, medications, stings, exercise, pressure, vibrations, extremes of temperature to name a few however majority of the time there is no identifiable trigger.  Your symptoms have been ongoing for >6 weeks making this chronic thus will obtain labwork to evaluate: CBC w diff, CMP, tryptase, hive panel, environmental panel, alpha-gal panel  - start the following high-dose antihistamine regimen:         Zyrtec 10mg  twice a day        Zantac 150mg  twice a day         Singulair 10mg  daily  - Xolair monthly injections discussed as treatment option if medication management is not effective enough.  Provided with informational brochure  today.    - let us know if hives leave any marks/bruising, you have fevers or joint aches/pains   Rhinoconjunctivitis, presumed allergic  - will obtain environmental allergy panel as above  - Zyrtec and singulair as above  - for itchy/watery/red eyes use Pataday or Pazeo 1 drop each eye as needed daily  Follow-up 1-2 months or sooner if needed  I appreciate the opportunity to take part in HaskellBarbra's care. Please do not hesitate to contact me with questions.  Sincerely,   Margo AyeShaylar Dell Briner, MD Allergy/Immunology [facility]

## 2017-09-02 NOTE — Patient Instructions (Addendum)
Hives  - at this time etiology of hives is unknown.  Hives can be caused by a variety of different triggers including illness/infection, foods, medications, stings, exercise, pressure, vibrations, extremes of temperature to name a few however majority of the time there is no identifiable trigger.  Your symptoms have been ongoing for >6 weeks making this chronic thus will obtain labwork to evaluate: CBC w diff, CMP, tryptase, hive panel, environmental panel, alpha-gal panel  - start the following high-dose antihistamine regimen:         Zyrtec 10mg  twice a day        Zantac 150mg  twice a day         Singulair 10mg  daily  - Xolair monthly injections discussed as treatment option if medication management is not effective enough.  Provided with informational brochure today.    - let us know if hives leave any marks/bruising, you have fevers or joint aches/pains   Allergies  - will obtain environmental allergy panel as above  - Zyrtec and singulair as above  - for itchy/watery/red eyes use Pataday or Pazeo 1 drop each eye as needed daily  Follow-up 1-2 months or sooner if needed

## 2017-09-09 LAB — IGE+ALLERGENS ZONE 3(27)
Aspergillus Fumigatus IgE: 0.15 kU/L — AB
Bahia Grass IgE: 0.71 kU/L — AB
Cat Dander IgE: <0.1 kU/L
Cedar, Mountain IgE: 0.34 kU/L — AB
Cockroach, American IgE: <0.1 kU/L
Common Silver Birch IgE: 0.21 kU/L — AB
D Farinae IgE: 36 kU/L — AB
D Pteronyssinus IgE: 30.5 kU/L — AB
Dog Dander IgE: <0.1 kU/L
Elm, American IgE: 0.14 kU/L — AB
G002-IGE BERMUDA GRASS: 0.22 kU/L — AB
G008-IGE BLUEGRASS, KENTUCK: 1.47 kU/L — AB
IgE (Immunoglobulin E), Serum: 619 IU/mL — ABNORMAL HIGH (ref 6–495)
Johnson Grass IgE: 0.56 kU/L — AB
M001-IGE PENICILLIUM CHRYSOGEN: 1.2 kU/L — AB
Maple/Box Elder IgE: 3.35 kU/L — AB
Nettle IgE: 0.23 kU/L — AB
Pigweed, Rough IgE: <0.1 kU/L
Plantain, English IgE: 0.18 kU/L — AB
Stemphylium Herbarum IgE: <0.1 kU/L
T007-IGE OAK, WHITE: 0.26 kU/L — AB
T041-IGE HICKORY, WHITE: 0.42 kU/L — AB
W001-IGE RAGWEED, SHORT: 0.12 kU/L — AB
WHITE MULBERRY IGE: 0.16 kU/L — AB

## 2017-09-09 LAB — CBC WITH DIFFERENTIAL/PLATELET
BASOS: 0 %
Basophils Absolute: 0 10*3/uL (ref 0.0–0.2)
EOS (ABSOLUTE): 0.1 10*3/uL (ref 0.0–0.4)
EOS: 3 %
HEMATOCRIT: 41.5 % (ref 34.0–46.6)
Hemoglobin: 13.9 g/dL (ref 11.1–15.9)
Immature Grans (Abs): 0 10*3/uL (ref 0.0–0.1)
Immature Granulocytes: 0 %
LYMPHS ABS: 2.3 10*3/uL (ref 0.7–3.1)
Lymphs: 46 %
MCH: 30.5 pg (ref 26.6–33.0)
MCHC: 33.5 g/dL (ref 31.5–35.7)
MCV: 91 fL (ref 79–97)
MONOS ABS: 0.3 10*3/uL (ref 0.1–0.9)
Monocytes: 6 %
NEUTROS ABS: 2.4 10*3/uL (ref 1.4–7.0)
Neutrophils: 45 %
Platelets: 360 10*3/uL (ref 150–379)
RBC: 4.56 x10E6/uL (ref 3.77–5.28)
RDW: 13.5 % (ref 12.3–15.4)
WBC: 5.2 10*3/uL (ref 3.4–10.8)

## 2017-09-09 LAB — COMPREHENSIVE METABOLIC PANEL
A/G RATIO: 1.5 (ref 1.2–2.2)
ALT: 38 IU/L — ABNORMAL HIGH (ref 0–32)
AST: 17 IU/L (ref 0–40)
Albumin: 4.3 g/dL (ref 3.5–5.5)
Alkaline Phosphatase: 56 IU/L (ref 39–117)
BILIRUBIN TOTAL: 0.3 mg/dL (ref 0.0–1.2)
BUN/Creatinine Ratio: 9 (ref 9–23)
BUN: 7 mg/dL (ref 6–20)
CALCIUM: 9.4 mg/dL (ref 8.7–10.2)
CHLORIDE: 101 mmol/L (ref 96–106)
CO2: 22 mmol/L (ref 20–29)
Creatinine, Ser: 0.78 mg/dL (ref 0.57–1.00)
GFR calc Af Amer: 123 mL/min/{1.73_m2} (ref >59)
GFR, EST NON AFRICAN AMERICAN: 107 mL/min/{1.73_m2} (ref >59)
GLOBULIN, TOTAL: 2.9 g/dL (ref 1.5–4.5)
Glucose: 81 mg/dL (ref 65–99)
POTASSIUM: 4.6 mmol/L (ref 3.5–5.2)
SODIUM: 138 mmol/L (ref 134–144)
Total Protein: 7.2 g/dL (ref 6.0–8.5)

## 2017-09-09 LAB — ALPHA-GAL PANEL
Beef (Bos spp) IgE: <0.1 kU/L (ref <0.35)
Class Interpretation: 0
LAMB CLASS INTERPRETATION: 0
PORK CLASS INTERPRETATION: 0
Pork (Sus spp) IgE: <0.1 kU/L (ref <0.35)

## 2017-09-09 LAB — THYROID ANTIBODIES: Thyroperoxidase Ab SerPl-aCnc: 17 IU/mL (ref 0–34)

## 2017-09-09 LAB — TRYPTASE: TRYPTASE: 3.5 ug/L (ref 2.2–13.2)

## 2017-09-09 LAB — CHRONIC URTICARIA: cu index: 2.8 (ref <10)

## 2017-11-22 DIAGNOSIS — Z113 Encounter for screening for infections with a predominantly sexual mode of transmission: Secondary | ICD-10-CM | POA: Diagnosis not present

## 2017-11-22 DIAGNOSIS — L0291 Cutaneous abscess, unspecified: Secondary | ICD-10-CM | POA: Diagnosis not present

## 2017-11-22 DIAGNOSIS — N926 Irregular menstruation, unspecified: Secondary | ICD-10-CM | POA: Diagnosis not present

## 2017-11-22 DIAGNOSIS — Z01411 Encounter for gynecological examination (general) (routine) with abnormal findings: Secondary | ICD-10-CM | POA: Diagnosis not present

## 2017-11-22 DIAGNOSIS — L732 Hidradenitis suppurativa: Secondary | ICD-10-CM | POA: Diagnosis not present

## 2017-11-22 DIAGNOSIS — Z124 Encounter for screening for malignant neoplasm of cervix: Secondary | ICD-10-CM | POA: Diagnosis not present

## 2017-11-22 DIAGNOSIS — Z6841 Body Mass Index (BMI) 40.0 and over, adult: Secondary | ICD-10-CM | POA: Diagnosis not present

## 2017-12-06 DIAGNOSIS — L0291 Cutaneous abscess, unspecified: Secondary | ICD-10-CM | POA: Diagnosis not present

## 2017-12-06 DIAGNOSIS — Z9889 Other specified postprocedural states: Secondary | ICD-10-CM | POA: Diagnosis not present

## 2017-12-06 DIAGNOSIS — L02412 Cutaneous abscess of left axilla: Secondary | ICD-10-CM | POA: Diagnosis not present

## 2017-12-06 DIAGNOSIS — Z3049 Encounter for surveillance of other contraceptives: Secondary | ICD-10-CM | POA: Diagnosis not present

## 2018-01-11 DIAGNOSIS — Z3040 Encounter for surveillance of contraceptives, unspecified: Secondary | ICD-10-CM | POA: Diagnosis not present

## 2018-01-24 DIAGNOSIS — Z3040 Encounter for surveillance of contraceptives, unspecified: Secondary | ICD-10-CM | POA: Diagnosis not present

## 2018-01-24 DIAGNOSIS — Z30017 Encounter for initial prescription of implantable subdermal contraceptive: Secondary | ICD-10-CM | POA: Diagnosis not present

## 2018-03-03 DIAGNOSIS — N898 Other specified noninflammatory disorders of vagina: Secondary | ICD-10-CM | POA: Diagnosis not present

## 2018-03-03 DIAGNOSIS — A599 Trichomoniasis, unspecified: Secondary | ICD-10-CM | POA: Diagnosis not present

## 2018-03-30 DIAGNOSIS — A599 Trichomoniasis, unspecified: Secondary | ICD-10-CM | POA: Diagnosis not present

## 2018-08-06 ENCOUNTER — Other Ambulatory Visit: Payer: Self-pay

## 2018-08-06 ENCOUNTER — Emergency Department (HOSPITAL_COMMUNITY): Payer: 59

## 2018-08-06 ENCOUNTER — Emergency Department (HOSPITAL_COMMUNITY)
Admission: EM | Admit: 2018-08-06 | Discharge: 2018-08-06 | Disposition: A | Discharge status: Home / Self Care | Payer: 59 | Attending: Emergency Medicine | Admitting: Emergency Medicine

## 2018-08-06 ENCOUNTER — Encounter (HOSPITAL_COMMUNITY): Payer: Self-pay | Admitting: Emergency Medicine

## 2018-08-06 DIAGNOSIS — M5489 Other dorsalgia: Secondary | ICD-10-CM | POA: Insufficient documentation

## 2018-08-06 DIAGNOSIS — Y999 Unspecified external cause status: Secondary | ICD-10-CM | POA: Insufficient documentation

## 2018-08-06 DIAGNOSIS — R51 Headache: Secondary | ICD-10-CM | POA: Diagnosis not present

## 2018-08-06 DIAGNOSIS — S161XXA Strain of muscle, fascia and tendon at neck level, initial encounter: Secondary | ICD-10-CM | POA: Diagnosis not present

## 2018-08-06 DIAGNOSIS — X500XXA Overexertion from strenuous movement or load, initial encounter: Secondary | ICD-10-CM | POA: Insufficient documentation

## 2018-08-06 DIAGNOSIS — F17210 Nicotine dependence, cigarettes, uncomplicated: Secondary | ICD-10-CM | POA: Diagnosis not present

## 2018-08-06 DIAGNOSIS — Y9384 Activity, sleeping: Secondary | ICD-10-CM | POA: Diagnosis not present

## 2018-08-06 DIAGNOSIS — Z79899 Other long term (current) drug therapy: Secondary | ICD-10-CM | POA: Diagnosis not present

## 2018-08-06 DIAGNOSIS — Y929 Unspecified place or not applicable: Secondary | ICD-10-CM | POA: Diagnosis not present

## 2018-08-06 DIAGNOSIS — M542 Cervicalgia: Secondary | ICD-10-CM | POA: Diagnosis not present

## 2018-08-06 DIAGNOSIS — J45909 Unspecified asthma, uncomplicated: Secondary | ICD-10-CM | POA: Insufficient documentation

## 2018-08-06 DIAGNOSIS — S199XXA Unspecified injury of neck, initial encounter: Secondary | ICD-10-CM | POA: Diagnosis present

## 2018-08-06 LAB — BASIC METABOLIC PANEL
Anion gap: 6 (ref 5–15)
BUN: 10 mg/dL (ref 6–20)
CO2: 25 mmol/L (ref 22–32)
Calcium: 9.3 mg/dL (ref 8.9–10.3)
Chloride: 106 mmol/L (ref 98–111)
Creatinine, Ser: 0.9 mg/dL (ref 0.44–1.00)
GFR calc Af Amer: >60 mL/min (ref >60)
GFR calc non Af Amer: >60 mL/min (ref >60)
Glucose, Bld: 111 mg/dL — ABNORMAL HIGH (ref 70–99)
Potassium: 4.5 mmol/L (ref 3.5–5.1)
Sodium: 137 mmol/L (ref 135–145)

## 2018-08-06 LAB — CBC WITH DIFFERENTIAL/PLATELET
Abs Immature Granulocytes: 0.01 10*3/uL (ref 0.00–0.07)
Basophils Absolute: 0 10*3/uL (ref 0.0–0.1)
Basophils Relative: 0 %
Eosinophils Absolute: 0 10*3/uL (ref 0.0–0.5)
Eosinophils Relative: 1 %
HCT: 43.2 % (ref 36.0–46.0)
Hemoglobin: 13.8 g/dL (ref 12.0–15.0)
Immature Granulocytes: 0 %
Lymphocytes Relative: 27 %
Lymphs Abs: 2 10*3/uL (ref 0.7–4.0)
MCH: 29.6 pg (ref 26.0–34.0)
MCHC: 31.9 g/dL (ref 30.0–36.0)
MCV: 92.7 fL (ref 80.0–100.0)
Monocytes Absolute: 0.3 10*3/uL (ref 0.1–1.0)
Monocytes Relative: 5 %
Neutro Abs: 4.8 10*3/uL (ref 1.7–7.7)
Neutrophils Relative %: 67 %
Platelets: 350 10*3/uL (ref 150–400)
RBC: 4.66 MIL/uL (ref 3.87–5.11)
RDW: 12.4 % (ref 11.5–15.5)
WBC: 7.1 10*3/uL (ref 4.0–10.5)
nRBC: 0 % (ref 0.0–0.2)

## 2018-08-06 LAB — I-STAT BETA HCG BLOOD, ED (MC, WL, AP ONLY): I-stat hCG, quantitative: <5 m[IU]/mL (ref <5)

## 2018-08-06 MED ORDER — OXYCODONE-ACETAMINOPHEN 5-325 MG PO TABS
1.0000 | ORAL_TABLET | Freq: Once | ORAL | Status: AC
  Administered 2018-08-06: 1 via ORAL
  Filled 2018-08-06: qty 1

## 2018-08-06 MED ORDER — IOPAMIDOL (ISOVUE-370) INJECTION 76%
75.0000 mL | Freq: Once | INTRAVENOUS | Status: AC | PRN
  Administered 2018-08-06: 75 mL via INTRAVENOUS

## 2018-08-06 MED ORDER — CYCLOBENZAPRINE HCL 10 MG PO TABS
5.0000 mg | ORAL_TABLET | Freq: Once | ORAL | Status: AC
  Administered 2018-08-06: 5 mg via ORAL
  Filled 2018-08-06: qty 1

## 2018-08-06 MED ORDER — CYCLOBENZAPRINE HCL 10 MG PO TABS: 10.0000 mg | ORAL_TABLET | Freq: Two times a day (BID) | ORAL | 0 refills | Status: DC | PRN

## 2018-08-06 MED ORDER — CYCLOBENZAPRINE HCL 10 MG PO TABS: 10.0000 mg | ORAL_TABLET | Freq: Once | ORAL | Status: DC

## 2018-08-06 MED ORDER — METHOCARBAMOL 500 MG PO TABS
1000.0000 mg | ORAL_TABLET | Freq: Once | ORAL | Status: AC
  Administered 2018-08-06: 1000 mg via ORAL
  Filled 2018-08-06: qty 2

## 2018-08-06 MED ORDER — IBUPROFEN 400 MG PO TABS
400.0000 mg | ORAL_TABLET | Freq: Once | ORAL | Status: AC | PRN
  Administered 2018-08-06: 400 mg via ORAL
  Filled 2018-08-06: qty 1

## 2018-08-06 NOTE — ED Notes (Signed)
ED Provider at bedside. 

## 2018-08-06 NOTE — ED Notes (Signed)
Patient is back at [facility] stating that she is in sever pain and that she has been here for 2 hours and still has pain.

## 2018-08-06 NOTE — ED Notes (Signed)
Took patient saline lock out patient is resting with call bell in reach and family in room

## 2018-08-06 NOTE — ED Notes (Signed)
This RN split flexeril pill in half in room in front of patient to give pt 5mg , pt says she dropped it, pt stood up and shook her shirt out and this RN looked around the room with no pill found,  This RN explained the risks of taking extra flexeril if she did in fact swallow the first half. Pt aware and states she did not swallow the first half.  Pt given 2nd half.

## 2018-08-06 NOTE — ED Notes (Signed)
Discharge instructions and prescription discussed with Pt. Pt verbalized understanding. Pt stable and ambulatory.    

## 2018-08-06 NOTE — ED Provider Notes (Signed)
[facility] EMERGENCY DEPARTMENT Provider Note   CSN: [id number] Arrival date & time: 08/06/18  0248    History   Chief Complaint Chief Complaint  Patient presents with  . Neck Pain    HPI Diamond Bolton is a 28 y.o. female.     HPI   28 year old female presents today with complaints of neck pain.  Patient notes she woke up with a sharp pain in her right posterior lateral neck.  She notes this feels sharp in sensation.  She notes pain with range of motion of the neck.  She reports pain in the trapezius as well, she denies any distal neurological deficits, denies any dizziness, confusion. She does note having a headache at the same time as the neck pain.  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  Patient received Motrin prior to my evaluation which did not improve her symptoms.  Past Medical History:  Diagnosis Date  . Asthma   . Urticaria     Patient Active Problem List   Diagnosis Date Noted  . S/P cesarean section 09/09/2013  . Personal history of previous postdates pregnancy 09/07/2013    Past Surgical History:  Procedure Laterality Date  . addenoidectomy    . ANKLE FRACTURE SURGERY Right 09/18/2014  . CESAREAN SECTION N/A 09/09/2013   Procedure: CESAREAN SECTION;  Surgeon: Levie Heritage, DO;  Location: [facility];  Service: Gynecology;  Laterality: N/A;  . NO PAST SURGERIES    . TONSILLECTOMY    . WISDOM TOOTH EXTRACTION       OB History    Gravida  1   Para  1   Term  1   Preterm      AB      Living  1     SAB      TAB      Ectopic      Multiple      Live Births  1            Home Medications    Prior to Admission medications   Medication Sig Start Date End Date Taking? Authorizing Provider  cetirizine (ZYRTEC) 10 MG tablet Take 1 tablet (10 mg total) by mouth daily. 09/02/17   Marcelyn Bruins, MD  cyclobenzaprine (FLEXERIL) 10 MG tablet Take 1 tablet  (10 mg total) by mouth 2 (two) times daily as needed for muscle spasms. 08/06/18   Shalamar Plourde, Tinnie Gens, PA-C  diphenhydrAMINE (BENADRYL) 25 MG tablet Take 25 mg by mouth every 6 (six) hours as needed.    [provider]  etonogestrel (NEXPLANON) 68 MG IMPL implant Inject 1 each into the skin once.    [provider]  montelukast (SINGULAIR) 10 MG tablet Take 1 tablet (10 mg total) by mouth at bedtime. 09/02/17   Marcelyn Bruins, MD  Olopatadine HCl (PATADAY) 0.2 % SOLN Place 1 drop into both eyes daily as needed. 09/02/17   Marcelyn Bruins, MD  ranitidine (ZANTAC) 150 MG tablet Take 1 tablet (150 mg total) by mouth 2 (two) times daily. 09/02/17   Marcelyn Bruins, MD    Family History Family History  Problem Relation Age of Onset  . Thyroid disease Mother   . Varicose Veins Mother   . Diabetes Sister   . Varicose Veins Sister   . Cancer Maternal Grandmother        breast  . Hypertension Maternal Aunt   . Hypertension  Maternal Uncle   . Cancer Maternal Grandfather        lung and brain    Social History Social History   Tobacco Use  . Smoking status: Current Every Day Smoker    Packs/day: 0.25    Years: 5.00    Pack years: 1.25    Types: Cigarettes    Last attempt to quit: 01/11/2013    Years since quitting: 5.5  . Smokeless tobacco: Never Used  Substance Use Topics  . Alcohol use: Yes    Comment: occassionally  . Drug use: Yes    Types: Marijuana     Allergies   Patient has no known allergies.   Review of Systems Review of Systems  All other systems reviewed and are negative.    Physical Exam Updated Vital Signs BP (!) 138/94 (BP Location: Right Arm)   Pulse 85   Temp 97.8 F (36.6 C) (Oral)   Resp 18   LMP 08/06/2018   SpO2 100%   Physical Exam Vitals signs and nursing note reviewed.  Constitutional:      Appearance: She is well-developed.  HENT:     Head: Normocephalic and atraumatic.  Eyes:     General: No  scleral icterus.       Right eye: No discharge.        Left eye: No discharge.     Conjunctiva/sclera: Conjunctivae normal.     Pupils: Pupils are equal, round, and reactive to light.  Neck:     Musculoskeletal: Normal range of motion.     Vascular: No JVD.     Trachea: No tracheal deviation.  Pulmonary:     Effort: Pulmonary effort is normal.     Breath sounds: No stridor.  Musculoskeletal:     Comments: Tenderness palpation of right lateral cervical musculature-no midline tenderness, anterior soft tissues nontender-bilateral upper and lower extremity sensation strength and motor function intact  Neurological:     General: No focal deficit present.     Mental Status: She is alert and oriented to person, place, and time.     GCS: GCS eye subscore is 4. GCS verbal subscore is 5. GCS motor subscore is 6.     Cranial Nerves: Cranial nerves are intact. No cranial nerve deficit.     Sensory: No sensory deficit.     Motor: No weakness.     Coordination: Coordination is intact. Coordination normal.  Psychiatric:        Behavior: Behavior normal.        Thought Content: Thought content normal.        Judgment: Judgment normal.      ED Treatments / Results  Labs (all labs ordered are listed, but only abnormal results are displayed) Labs Reviewed  BASIC METABOLIC PANEL - Abnormal; Notable for the following components:      Result Value   Glucose, Bld 111 (*)    All other components within normal limits  CBC WITH DIFFERENTIAL/PLATELET  I-STAT BETA HCG BLOOD, ED ([facility], [facility], [facility] ONLY)    EKG None  Radiology Ct Angio Head W Or Wo Contrast  Result Date: 08/06/2018 CLINICAL DATA:  Right-sided neck pain that awoke the patient EXAM: CT ANGIOGRAPHY HEAD AND NECK TECHNIQUE: Multidetector CT imaging of the head and neck was performed using the standard protocol during bolus administration of intravenous contrast. Multiplanar CT image reconstructions and MIPs were obtained to evaluate the  vascular anatomy. Carotid stenosis measurements (when applicable) are obtained utilizing NASCET criteria, using the  distal internal carotid diameter as the denominator. CONTRAST:  75mL ISOVUE-370 IOPAMIDOL (ISOVUE-370) INJECTION 76% COMPARISON:  None. FINDINGS: CT HEAD FINDINGS Brain: No evidence of acute infarction, hemorrhage, hydrocephalus, extra-axial collection or mass lesion/mass effect. Skull: Negative Sinuses: Negative Orbits: Negative CTA NECK FINDINGS Aortic arch: Normal where covered.  Three vessel branching. Right carotid system: No beading or flap to suggest dissection. Negative for stenosis. Left carotid system: No beading or flap to suggest dissection. No stenosis or atherosclerosis. Vertebral arteries: No proximal subclavian stenosis. Mild dominance of the right vertebral artery. Both vertebral arteries are smooth and widely patent. Skeleton: Negative.  No explanation for neck pain. Other neck: No evidence of mass or inflammation Upper chest: Negative Review of the MIP images confirms the above findings CTA HEAD FINDINGS Anterior circulation: Distal medium vessel visualization is limited by venous contamination. No stenosis, beading, aneurysm, or branch occlusion. Posterior circulation: Mild right vertebral artery dominance. The vertebral and basilar arteries are smooth and widely patent. No branch occlusion, beading, or aneurysm. Venous sinuses: Patent Anatomic variants: None significant Delayed phase: No abnormal intracranial enhancement Review of the MIP images confirms the above findings IMPRESSION: Negative CTA of the head and neck. Electronically Signed   By: Marnee Spring M.D.   On: 08/06/2018 11:08   Ct Angio Neck W And/or Wo Contrast  Result Date: 08/06/2018 CLINICAL DATA:  Right-sided neck pain that awoke the patient EXAM: CT ANGIOGRAPHY HEAD AND NECK TECHNIQUE: Multidetector CT imaging of the head and neck was performed using the standard protocol during bolus administration of  intravenous contrast. Multiplanar CT image reconstructions and MIPs were obtained to evaluate the vascular anatomy. Carotid stenosis measurements (when applicable) are obtained utilizing NASCET criteria, using the distal internal carotid diameter as the denominator. CONTRAST:  75mL ISOVUE-370 IOPAMIDOL (ISOVUE-370) INJECTION 76% COMPARISON:  None. FINDINGS: CT HEAD FINDINGS Brain: No evidence of acute infarction, hemorrhage, hydrocephalus, extra-axial collection or mass lesion/mass effect. Skull: Negative Sinuses: Negative Orbits: Negative CTA NECK FINDINGS Aortic arch: Normal where covered.  Three vessel branching. Right carotid system: No beading or flap to suggest dissection. Negative for stenosis. Left carotid system: No beading or flap to suggest dissection. No stenosis or atherosclerosis. Vertebral arteries: No proximal subclavian stenosis. Mild dominance of the right vertebral artery. Both vertebral arteries are smooth and widely patent. Skeleton: Negative.  No explanation for neck pain. Other neck: No evidence of mass or inflammation Upper chest: Negative Review of the MIP images confirms the above findings CTA HEAD FINDINGS Anterior circulation: Distal medium vessel visualization is limited by venous contamination. No stenosis, beading, aneurysm, or branch occlusion. Posterior circulation: Mild right vertebral artery dominance. The vertebral and basilar arteries are smooth and widely patent. No branch occlusion, beading, or aneurysm. Venous sinuses: Patent Anatomic variants: None significant Delayed phase: No abnormal intracranial enhancement Review of the MIP images confirms the above findings IMPRESSION: Negative CTA of the head and neck. Electronically Signed   By: Marnee Spring M.D.   On: 08/06/2018 11:08    Procedures Procedures (including critical care time)  Medications Ordered in ED Medications  cyclobenzaprine (FLEXERIL) tablet 5 mg (has no administration in time range)  ibuprofen  (ADVIL,MOTRIN) tablet 400 mg (400 mg Oral Given 08/06/18 0557)  oxyCODONE-acetaminophen (PERCOCET/ROXICET) 5-325 MG per tablet 1 tablet (1 tablet Oral Given 08/06/18 0721)  methocarbamol (ROBAXIN) tablet 1,000 mg (1,000 mg Oral Given 08/06/18 0828)  iopamidol (ISOVUE-370) 76 % injection 75 mL (75 mLs Intravenous Contrast Given 08/06/18 1036)     Initial Impression /  Assessment and Plan / ED Course  I have reviewed the triage vital signs and the nursing notes.  Pertinent labs & imaging results that were available during my care of the patient were reviewed by me and considered in my medical decision making (see chart for details).        Labs:   Imaging: CT angio head and neck  Consults:  Therapeutics: Percocet, Robaxin, Flexeril  Discharge Meds: Flexeril  Assessment/Plan: 28 year old female presents today with acute onset neck pain.  She is extremely uncomfortable tearful throughout my exam.  Patient was given Percocet, she was given Robaxin with no subjective improvement in symptoms.  She was able to sleep and was leaning to the left side when I entered the room.  Upon awakening she again to be tearful again.  Given her severity symptoms location and persistence dissection is on my differential.  I discussed the risks and benefits of CT angios head and neck patient is adamant that she wants further imaging at this time.  Patient CT has come back and is reassuring with no acute abnormalities.  Patient does seem to be improved but is still having discomfort in her neck.  She will be discharged home with Flexeril encouraged use Tylenol ibuprofen, follow-up as an outpatient return immediately if she develops any new or worsening signs or symptoms.  Patient verbalized understanding and agreement to today's plan had no further questions or concerns.    Final Clinical Impressions(s) / ED Diagnoses   Final diagnoses:  Strain of neck muscle, initial encounter    ED Discharge Orders          Ordered    cyclobenzaprine (FLEXERIL) 10 MG tablet  2 times daily PRN     08/06/18 1138           Eyvonne Mechanic, PA-C 08/06/18 1141    Linwood Dibbles, MD 08/07/18 1128

## 2018-08-06 NOTE — ED Triage Notes (Signed)
C/o non-radiating R sided neck pain that woke her up at 2am.  No known injury.

## 2018-08-06 NOTE — Discharge Instructions (Addendum)
Please read attached information. If you experience any new or worsening signs or symptoms please return to the emergency room for evaluation. Please follow-up with your primary care provider or specialist as discussed. Please use medication prescribed only as directed and discontinue taking if you have any concerning signs or symptoms.   °

## 2018-08-06 NOTE — ED Notes (Signed)
Pt requesting a neck brace during triage. Informed her that since no injury occurred it doesn't warrant a c-collar to be placed. Also demanding pain medicine stronger than motrin. Pt has been cussing at staff, informed pt if she cussed one more time I would have security escort her out. Cussed out [employer] EMT multiple times and myself.

## 2019-02-07 DIAGNOSIS — Z3049 Encounter for surveillance of other contraceptives: Secondary | ICD-10-CM | POA: Diagnosis not present

## 2019-02-07 DIAGNOSIS — Z3046 Encounter for surveillance of implantable subdermal contraceptive: Secondary | ICD-10-CM | POA: Diagnosis not present

## 2020-04-04 ENCOUNTER — Encounter (HOSPITAL_COMMUNITY): Payer: Self-pay | Admitting: Emergency Medicine

## 2020-04-04 ENCOUNTER — Encounter (HOSPITAL_COMMUNITY): Payer: Self-pay

## 2020-04-04 ENCOUNTER — Ambulatory Visit (HOSPITAL_COMMUNITY)
Admission: EM | Admit: 2020-04-04 | Discharge: 2020-04-04 | Disposition: A | Discharge status: Home / Self Care | Payer: Self-pay

## 2020-04-04 ENCOUNTER — Inpatient Hospital Stay (HOSPITAL_COMMUNITY)
Admission: AD | Admit: 2020-04-04 | Discharge: 2020-04-04 | Disposition: A | Discharge status: Home / Self Care | Payer: Medicaid Other | Attending: Obstetrics & Gynecology | Admitting: Obstetrics & Gynecology

## 2020-04-04 ENCOUNTER — Other Ambulatory Visit: Payer: Self-pay

## 2020-04-04 DIAGNOSIS — F17210 Nicotine dependence, cigarettes, uncomplicated: Secondary | ICD-10-CM | POA: Insufficient documentation

## 2020-04-04 DIAGNOSIS — N939 Abnormal uterine and vaginal bleeding, unspecified: Secondary | ICD-10-CM | POA: Diagnosis present

## 2020-04-04 DIAGNOSIS — Z3202 Encounter for pregnancy test, result negative: Secondary | ICD-10-CM

## 2020-04-04 LAB — BASIC METABOLIC PANEL WITH GFR
Anion gap: 8 (ref 5–15)
BUN: 7 mg/dL (ref 6–20)
CO2: 26 mmol/L (ref 22–32)
Calcium: 8.9 mg/dL (ref 8.9–10.3)
Chloride: 104 mmol/L (ref 98–111)
Creatinine, Ser: 1.13 mg/dL — ABNORMAL HIGH (ref 0.44–1.00)
GFR, Estimated: >60 mL/min
Glucose, Bld: 95 mg/dL (ref 70–99)
Potassium: 3.9 mmol/L (ref 3.5–5.1)
Sodium: 138 mmol/L (ref 135–145)

## 2020-04-04 LAB — CBC
HCT: 42.6 % (ref 36.0–46.0)
Hemoglobin: 13.8 g/dL (ref 12.0–15.0)
MCH: 30.7 pg (ref 26.0–34.0)
MCHC: 32.4 g/dL (ref 30.0–36.0)
MCV: 94.7 fL (ref 80.0–100.0)
Platelets: 323 10*3/uL (ref 150–400)
RBC: 4.5 MIL/uL (ref 3.87–5.11)
RDW: 12.1 % (ref 11.5–15.5)
WBC: 6.8 10*3/uL (ref 4.0–10.5)
nRBC: 0 % (ref 0.0–0.2)

## 2020-04-04 LAB — I-STAT BETA HCG BLOOD, ED (MC, WL, AP ONLY): I-stat hCG, quantitative: <5 m[IU]/mL (ref <5)

## 2020-04-04 MED ORDER — MEGESTROL ACETATE 40 MG PO TABS: 40.0000 mg | ORAL_TABLET | Freq: Two times a day (BID) | ORAL | 2 refills | Status: DC

## 2020-04-04 NOTE — Discharge Instructions (Addendum)
Follow-up at: [facility]   [address] [place], [place]  [phone]

## 2020-04-04 NOTE — ED Provider Notes (Signed)
[facility]    CSN: [id number] Arrival date & time: 04/04/20  1310      History   Chief Complaint Chief Complaint  Patient presents with  . Vaginal Bleeding    HPI Diamond Bolton is a 29 y.o. female.   HPI  Patient presents today with a concern of abnormal vaginal bleeding which has been ongoing since 03/15/20. Had normal period on 10/1 then bleeding restarted on 03/15/20. She endorses abdominal cramping. Bleeding is heavy at times. She has been sexually recently however used barrier protection and has no concern for STD.   Past Medical History:  Diagnosis Date  . Asthma   . Urticaria     Patient Active Problem List   Diagnosis Date Noted  . S/P cesarean section 09/09/2013  . Personal history of previous postdates pregnancy 09/07/2013    Past Surgical History:  Procedure Laterality Date  . addenoidectomy    . ANKLE FRACTURE SURGERY Right 09/18/2014  . CESAREAN SECTION N/A 09/09/2013   Procedure: CESAREAN SECTION;  Surgeon: Levie Heritage, DO;  Location: [facility];  Service: Gynecology;  Laterality: N/A;  . NO PAST SURGERIES    . TONSILLECTOMY    . WISDOM TOOTH EXTRACTION      OB History    Gravida  1   Para  1   Term  1   Preterm      AB      Living  1     SAB      TAB      Ectopic      Multiple      Live Births  1            Home Medications    Prior to Admission medications   Medication Sig Start Date End Date Taking? Authorizing Provider  cetirizine (ZYRTEC) 10 MG tablet Take 1 tablet (10 mg total) by mouth daily. 09/02/17   Marcelyn Bruins, MD  cyclobenzaprine (FLEXERIL) 10 MG tablet Take 1 tablet (10 mg total) by mouth 2 (two) times daily as needed for muscle spasms. 08/06/18   Hedges, Tinnie Gens, PA-C  diphenhydrAMINE (BENADRYL) 25 MG tablet Take 25 mg by mouth every 6 (six) hours as needed.    [provider]  etonogestrel (NEXPLANON) 68 MG IMPL implant Inject 1 each into the skin once.    [provider]  montelukast (SINGULAIR) 10 MG tablet Take 1 tablet (10 mg total) by mouth at bedtime. 09/02/17   Marcelyn Bruins, MD  Olopatadine HCl (PATADAY) 0.2 % SOLN Place 1 drop into both eyes daily as needed. 09/02/17   Marcelyn Bruins, MD  ranitidine (ZANTAC) 150 MG tablet Take 1 tablet (150 mg total) by mouth 2 (two) times daily. 09/02/17   Marcelyn Bruins, MD    Family History Family History  Problem Relation Age of Onset  . Thyroid disease Mother   . Varicose Veins Mother   . Diabetes Sister   . Varicose Veins Sister   . Cancer Maternal Grandmother        breast  . Hypertension Maternal Aunt   . Hypertension Maternal Uncle   . Cancer Maternal Grandfather        lung and brain    Social History Social History   Tobacco Use  . Smoking status: Current Every Day Smoker    Packs/day: 0.25    Years: 5.00    Pack years: 1.25    Types: Cigarettes    Last attempt  to quit: 01/11/2013    Years since quitting: 7.2  . Smokeless tobacco: Never Used  Substance Use Topics  . Alcohol use: Yes    Comment: occassionally  . Drug use: Yes    Types: Marijuana     Allergies   Patient has no known allergies. Review of Systems Review of Systems Pertinent negatives listed in HPI  Physical Exam Triage Vital Signs ED Triage Vitals  Enc Vitals Group     BP 04/04/20 1437 129/82     Pulse Rate 04/04/20 1437 70     Resp 04/04/20 1437 19     Temp 04/04/20 1437 98.7 F (37.1 C)     Temp Source 04/04/20 1437 Oral     SpO2 04/04/20 1437 97 %     Weight --      Height --      Head Circumference --      Peak Flow --      Pain Score 04/04/20 1435 7     Pain Loc --      Pain Edu? --      Excl. in GC? --    No data found.  Updated Vital Signs BP 129/82 (BP Location: Right Arm)   Pulse 70   Temp 98.7 F (37.1 C) (Oral)   Resp 19   LMP 03/01/2020 (Approximate)   SpO2 97%   Breastfeeding No   Visual Acuity Right Eye Distance:   Left Eye  Distance:   Bilateral Distance:    Right Eye Near:   Left Eye Near:    Bilateral Near:     Physical Exam General appearance: alert, well developed, well nourished Head: Normocephalic, without obvious abnormality, atraumatic Respiratory: Respirations even and unlabored, normal respiratory rate Heart: rate and rhythm normal.  Psych: Tearful flat affect. Limited exam patient left given no vaginal ultrasound present  UC Treatments / Results  Labs (all labs ordered are listed, but only abnormal results are displayed) Labs Reviewed - No data to display  EKG   Radiology No results found.  Procedures Procedures (including critical care time)  Medications Ordered in UC Medications - No data to display  Initial Impression / Assessment and Plan / UC Course  I have reviewed the triage vital signs and the nursing notes.  Pertinent labs & imaging results that were available during my care of the patient were reviewed by me and considered in my medical decision making (see chart for details).     Patient became disgruntled after being told that for full work-up of the source of her vaginal bleeding she would be required to follow-up with gynecology as this is urgent care I can only monitor for anemia treat cramping and check for STIs and UA as sources of her abdominal cramping.  Patient requested a work note and discharge papers.  Information given to follow-up with [facility]. Final Clinical Impressions(s) / UC Diagnoses   Final diagnoses:  Abnormal uterine bleeding (AUB)     Discharge Instructions     Follow-up at: [facility]   [address] [place], [place]  [phone]    ED Prescriptions    None     PDMP not reviewed this encounter.   Bing Neighbors, FNP 04/04/20 1513

## 2020-04-04 NOTE — ED Triage Notes (Signed)
Pt reports vaginal bleeding for 20-25 days. Hx of abortion in May and off birth control for over a year. Endorses abd cramping. Denies N/V/D.

## 2020-04-04 NOTE — MAU Provider Note (Signed)
History     CSN: [id number]  Arrival date and time: 04/04/20 1515   None     Chief Complaint  Patient presents with  . Vaginal Bleeding   HPI  Patient is a G1P1 not currently pregnant who is a transfer from the ER for abnormal uterine bleeding. Patient reports that she is not currently on birth control. She had her normal period in beginning of October that lasted 4 days. She stopped bleeding and then bleeding picked up again around October 11th and she has continued bleeding. Bleeding has gotten heavier over past two weeks. She needs to change tampons every 2-3 hours and is passing some small, nickel-sized clots. She has taken pregnancy tests at home that have been negative. Denies nausea, vomiting, dizziness. Does report fatigue.  In ER had neg UPT and CBC notable for hgb 13.8 and wbc 6.8.   Patient has a family history of fibroids. Also has family history of breast and ovarian cancer in her maternal grandmother and aunts, but no first degree relatives. Does not know if they have had BRCA testing.   OB History    Gravida  1   Para  1   Term  1   Preterm      AB      Living  1     SAB      TAB      Ectopic      Multiple      Live Births  1           Past Medical History:  Diagnosis Date  . Asthma   . Urticaria     Past Surgical History:  Procedure Laterality Date  . addenoidectomy    . ANKLE FRACTURE SURGERY Right 09/18/2014  . CESAREAN SECTION N/A 09/09/2013   Procedure: CESAREAN SECTION;  Surgeon: Truett Mainland, DO;  Location: [facility];  Service: Gynecology;  Laterality: N/A;  . NO PAST SURGERIES    . TONSILLECTOMY    . WISDOM TOOTH EXTRACTION      Family History  Problem Relation Age of Onset  . Thyroid disease Mother   . Varicose Veins Mother   . Diabetes Sister   . Varicose Veins Sister   . Cancer Maternal Grandmother        breast  . Hypertension Maternal Aunt   . Hypertension Maternal Uncle   . Cancer Maternal Grandfather         lung and brain    Social History   Tobacco Use  . Smoking status: Current Every Day Smoker    Packs/day: 0.25    Years: 5.00    Pack years: 1.25    Types: Cigarettes    Last attempt to quit: 01/11/2013    Years since quitting: 7.2  . Smokeless tobacco: Never Used  Substance Use Topics  . Alcohol use: Yes    Comment: occassionally  . Drug use: Yes    Types: Marijuana    Allergies: No Known Allergies  Medications Prior to Admission  Medication Sig Dispense Refill Last Dose  . cetirizine (ZYRTEC) 10 MG tablet Take 1 tablet (10 mg total) by mouth daily. 30 tablet 5   . cyclobenzaprine (FLEXERIL) 10 MG tablet Take 1 tablet (10 mg total) by mouth 2 (two) times daily as needed for muscle spasms. 20 tablet 0   . diphenhydrAMINE (BENADRYL) 25 MG tablet Take 25 mg by mouth every 6 (six) hours as needed.     . etonogestrel (NEXPLANON) 68 MG  IMPL implant Inject 1 each into the skin once.     . montelukast (SINGULAIR) 10 MG tablet Take 1 tablet (10 mg total) by mouth at bedtime. 30 tablet 5   . Olopatadine HCl (PATADAY) 0.2 % SOLN Place 1 drop into both eyes daily as needed. 1 Bottle 5   . ranitidine (ZANTAC) 150 MG tablet Take 1 tablet (150 mg total) by mouth 2 (two) times daily. 60 tablet 5     Review of Systems  Constitutional: Positive for fatigue. Negative for chills and fever.  Respiratory: Negative for chest tightness.   Gastrointestinal: Negative for blood in stool, constipation and diarrhea.  Genitourinary: Positive for menstrual problem. Negative for dysuria and flank pain.  Neurological: Negative for dizziness.   Physical Exam   Blood pressure 123/78, pulse 65, temperature 98.6 F (37 C), resp. rate 18, height _0  (1.676 m), weight 108 kg, last menstrual period 04/03/2020, SpO2 100 %.  Physical Exam Vitals and nursing note reviewed.  Constitutional:      Appearance: Normal appearance.  Cardiovascular:     Rate and Rhythm: Normal rate.  Pulmonary:     Effort:  Pulmonary effort is normal.  Abdominal:     Palpations: Abdomen is soft.  Neurological:     Mental Status: She is alert.  Psychiatric:        Mood and Affect: Mood normal.        Behavior: Behavior normal.     MAU Course  Procedures  MDM Patient seen at bedside Labs reviewed, already obtained in ER (hgb 13.8), UPT negative  Discussed care plan with patient Script sent for Megace, order for outpatient [place] and plan for follow up in clinic   Assessment and Plan   Abnormal uterine bleeding - suspect likely fibroids vs polyp, neg UPT today and neg UPT at home.  -hemodynamically stable, hgb stable -patient is current every day smoker, plan to treat with megace 42m bid for 10 days followed by daily 40 mg megace until reassessed by provider -TVUS ordered as outpatient -message sent to [facility] to schedule as outpatient. Patient counseled on return precautions and verbalizes understanding    JSharene Skeans MD [facility]av[facility] CampusFamily Medicine Fellow, F[facility] Hospital[facility] [place]

## 2020-04-04 NOTE — ED Triage Notes (Addendum)
Pt in with c/o pro longed vaginal bleeding. States that she had her menstrual cycle 10/1 that last a few days. Bleeding then restarted on 10/15 and has not stopped and become heavier per pt. States that blood is brown and also bright red.  Also c/o upper abdominal pain   Denies dysuria, vaginal irritation, burning or discharge

## 2020-04-04 NOTE — ED Triage Notes (Signed)
Emergency Medicine Provider OB Triage Evaluation Note  Diamond Bolton is a 29 y.o. female, G1P1001, at Unknown gestation who presents to the emergency department with complaints of vaginal bleeding. States she had a period earlier this month but following this she started bleeding again and now she is passing clots.  Review of  Systems  Positive: vaginal bleeding, cramping Negative: syncope  Physical Exam  BP (!) 125/98   Pulse 62   Temp 98.6 F (37 C) (Oral)   Resp 18   SpO2 100%  General: Awake, no distress  HEENT: Atraumatic  Resp: Normal effort  Cardiac: Normal rate Abd: Nondistended, nontender  MSK: Moves all extremities without difficulty Neuro: Speech clear  Medical Decision Making  Pt evaluated for pregnancy concern and is stable for transfer to MAU. Pt is in agreement with plan for transfer.  4:39 PM Discussed with MAU provider, Dr. Myriam Jacobson who accepts patient in transfer.  Clinical Impression  No diagnosis found.     Karrie Meres, [place] 04/04/20 1639

## 2020-04-04 NOTE — MAU Note (Signed)
Had a period  On October 3 that last 5 day Then it stopped. Bleeding started again on Oct 11 spotting and brown blood that steadily had gotten heavier and brighter red. Not stopped since 03/11/20. Pt reprot increased abd cramping in upper and lower part of her abd.  Passing clots niickle sized clot.

## 2020-04-04 NOTE — Discharge Instructions (Signed)
I have reached out to several of our offices and they should be contacting you with an appointment. I have also placed a referral for an ultrasound.   [facility]: [phone] Address: [address], [place], [place] [address]  [facility]: [phone] Address: [address], [place], [place] [address]   Menorrhagia  Menorrhagia is a condition in which menstrual periods are heavy or last longer than normal. With menorrhagia, most periods a woman has may cause enough blood loss and cramping that she becomes unable to take part in her usual activities. What are the causes? Common causes of this condition include:  Noncancerous growths in the uterus (polyps or fibroids).  An imbalance of the estrogen and progesterone hormones.  One of the ovaries not releasing an egg during one or more months.  A problem with the thyroid gland (hypothyroid).  Side effects of having an intrauterine device (IUD).  Side effects of some medicines, such as anti-inflammatory medicines or blood thinners.  A bleeding disorder that stops the blood from clotting normally. In some cases, the cause of this condition is not known. What are the signs or symptoms? Symptoms of this condition include:  Routinely having to change your pad or tampon every 1-2 hours because it is completely soaked.  Needing to use pads and tampons at the same time because of heavy bleeding.  Needing to wake up to change your pads or tampons during the night.  Passing blood clots larger than 1 inch (2.5 cm) in size.  Having bleeding that lasts for more than 7 days.  Having symptoms of low iron levels (anemia), such as tiredness, fatigue, or shortness of breath. How is this diagnosed? This condition may be diagnosed based on:  A physical exam.  Your symptoms and menstrual history.  Tests, such as: ? Blood tests to check if you are pregnant or have hormonal changes, a bleeding or thyroid disorder, anemia, or other  problems. ? Pap test to check for cancerous changes, infections, or inflammation. ? Endometrial biopsy. This test involves removing a tissue sample from the lining of the uterus (endometrium) to be examined under a microscope. ? Pelvic ultrasound. This test uses sound waves to create images of your uterus, ovaries, and vagina. The images can show if you have fibroids or other growths. ? Hysteroscopy. For this test, a small telescope is used to look inside your uterus. How is this treated? Treatment may not be needed for this condition. If it is needed, the best treatment for you will depend on:  Whether you need to prevent pregnancy.  Your desire to have children in the future.  The cause and severity of your bleeding.  Your personal preference. Medicines are the first step in treatment. You may be treated with:  Hormonal birth control methods. These treatments reduce bleeding during your menstrual period. They include: ? Birth control pills. ? Skin patch. ? Vaginal ring. ? Shots (injections) that you get every 3 months. ? Hormonal IUD (intrauterine device). ? Implants that go under the skin.  Medicines that thicken blood and slow bleeding.  Medicines that reduce swelling, such as ibuprofen.  Medicines that contain an artificial (synthetic) hormone called progestin.  Medicines that make the ovaries stop working for a short time.  Iron supplements to treat anemia. If medicines do not work, surgery may be done. Surgical options may include:  Dilation and curettage (D&C). In this procedure, your health care provider opens (dilates) your cervix and then scrapes or suctions tissue  from the endometrium to reduce menstrual bleeding.  Operative hysteroscopy. In this procedure, a small tube with a light on the end (hysteroscope) is used to view your uterus and help remove polyps that may be causing heavy periods.  Endometrial ablation. This is when various techniques are used to  permanently destroy your entire endometrium. After endometrial ablation, most women have little or no menstrual flow. This procedure reduces your ability to become pregnant.  Endometrial resection. In this procedure, an electrosurgical wire loop is used to remove the endometrium. This procedure reduces your ability to become pregnant.  Hysterectomy. This is surgical removal of the uterus. This is a permanent procedure that stops menstrual periods. Pregnancy is not possible after a hysterectomy. Follow these instructions at home: Medicines  Take over-the-counter and prescription medicines exactly as told by your health care provider. This includes iron pills.  Do not change or switch medicines without asking your health care provider.  Do not take aspirin or medicines that contain aspirin 1 week before or during your menstrual period. Aspirin may make bleeding worse. General instructions  If you need to change your sanitary pad or tampon more than once every 2 hours, limit your activity until the bleeding stops.  Iron pills can cause constipation. To prevent or treat constipation while you are taking prescription iron supplements, your health care provider may recommend that you: ? Drink enough fluid to keep your urine clear or pale yellow. ? Take over-the-counter or prescription medicines. ? Eat foods that are high in fiber, such as fresh fruits and vegetables, whole grains, and beans. ? Limit foods that are high in fat and processed sugars, such as fried and sweet foods.  Eat well-balanced meals, including foods that are high in iron. Foods that have a lot of iron include leafy green vegetables, meat, liver, eggs, and whole grain breads and cereals.  Do not try to lose weight until the abnormal bleeding has stopped and your blood iron level is back to normal. If you need to lose weight, work with your health care provider to lose weight safely.  Keep all follow-up visits as told by your  health care provider. This is important. Contact a health care provider if:  You soak through a pad or tampon every 1 or 2 hours, and this happens every time you have a period.  You need to use pads and tampons at the same time because you are bleeding so much.  You have nausea, vomiting, diarrhea, or other problems related to medicines you are taking. Get help right away if:  You soak through more than a pad or tampon in 1 hour.  You pass clots bigger than 1 inch (2.5 cm) wide.  You feel short of breath.  You feel like your heart is beating too fast.  You feel dizzy or faint.  You feel very weak or tired. Summary  Menorrhagia is a condition in which menstrual periods are heavy or last longer than normal.  Treatment will depend on the cause of the condition and may include medicines or procedures.  Take over-the-counter and prescription medicines exactly as told by your health care provider. This includes iron pills.  Get help right away if you have heavy bleeding that soaks through more than a pad or tampon in 1 hour, you are passing large clots, or you feel dizzy, faint or short of breath. This information is not intended to replace advice given to you by your health care provider. Make sure you discuss  any questions you have with your health care provider. Document Revised: 08/25/2017 Document Reviewed: 05/11/2016 Elsevier Patient Education  2020 [employer].

## 2020-05-02 ENCOUNTER — Ambulatory Visit: Payer: Medicaid Other | Admitting: Obstetrics and Gynecology

## 2020-05-23 ENCOUNTER — Other Ambulatory Visit: Payer: Self-pay

## 2020-05-23 ENCOUNTER — Ambulatory Visit
Admission: RE | Admit: 2020-05-23 | Discharge: 2020-05-23 | Disposition: A | Discharge status: Home / Self Care | Payer: Medicaid Other | Source: Ambulatory Visit | Attending: Obstetrics and Gynecology | Admitting: Obstetrics and Gynecology

## 2020-05-23 DIAGNOSIS — N939 Abnormal uterine and vaginal bleeding, unspecified: Secondary | ICD-10-CM | POA: Diagnosis not present

## 2020-08-16 IMAGING — CT CT ANGIO NECK
1 of 12 series · 5 of 33 positions shown · IV contrast (iopamidol)
Comparison: None.

CLINICAL DATA: Right-sided neck pain that awoke the patient

EXAM:
CT ANGIOGRAPHY HEAD AND NECK
TECHNIQUE: Multidetector CT imaging of the head and neck was performed using
the standard protocol during bolus administration of intravenous
contrast. Multiplanar CT image reconstructions and MIPs were
obtained to evaluate the vascular anatomy. Carotid stenosis
measurements (when applicable) are obtained utilizing NASCET
criteria, using the distal internal carotid diameter as the
denominator.
CONTRAST:  75mL 0O421L-HYS IOPAMIDOL (0O421L-HYS) INJECTION 76%

[Series 14: cta neck axial · axial · 0.39mm/px · z∈[-310,-100]mm · 5 of 316 slices shown]
[im 53/316  soft-tissue]
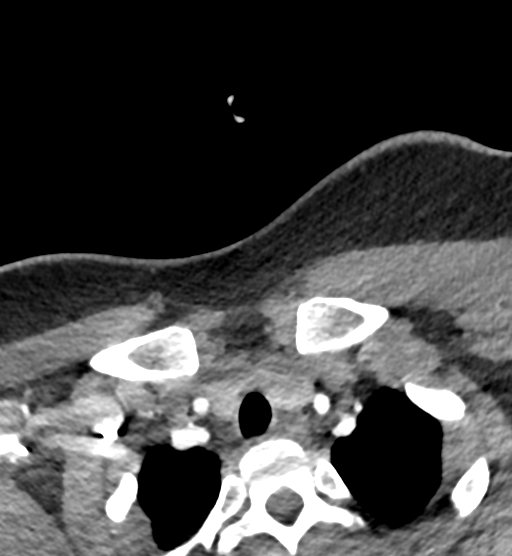
[im 106/316  bone]
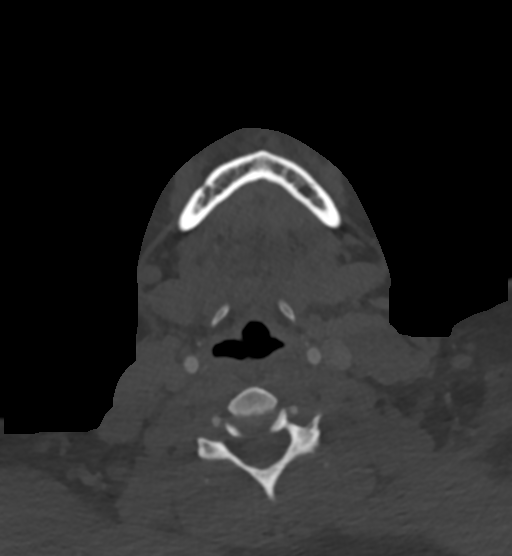
[im 158/316  soft-tissue]
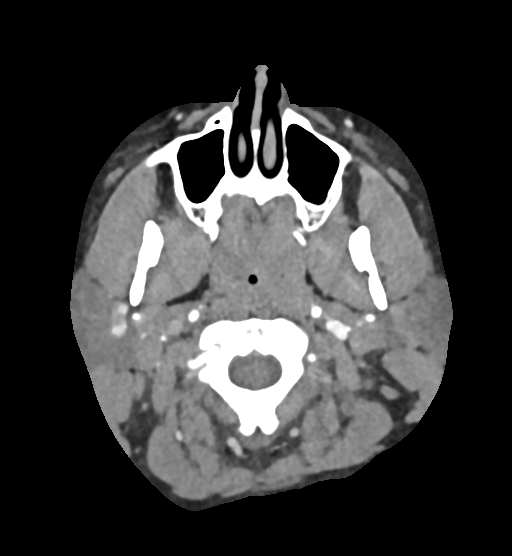
[im 211/316  bone]
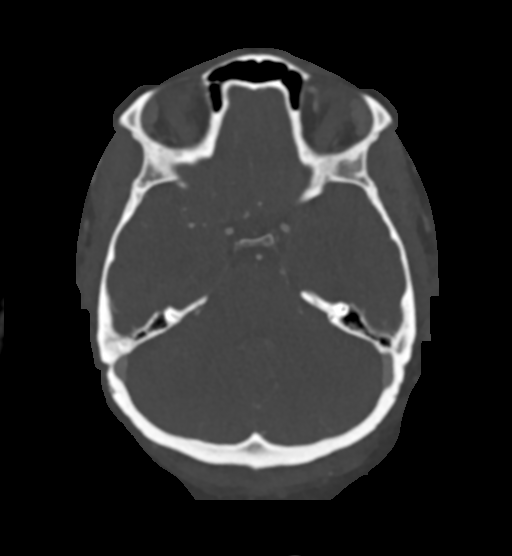
[im 263/316  soft-tissue]
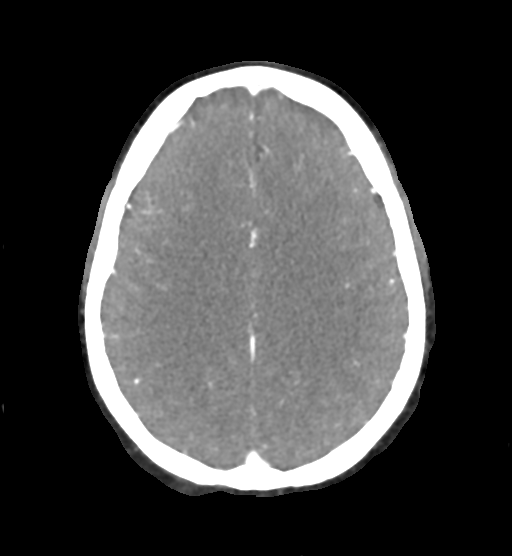

[5 of 33 positions shown; findings below may reference images not displayed]

FINDINGS: CT HEAD FINDINGS

Brain: No evidence of acute infarction, hemorrhage, hydrocephalus,
extra-axial collection or mass lesion/mass effect.

Skull: Negative

Sinuses: Negative

Orbits: Negative

CTA NECK FINDINGS

Aortic arch: Normal where covered.  Three vessel branching.

Right carotid system: No beading or flap to suggest dissection.
Negative for stenosis.

Left carotid system: No beading or flap to suggest dissection. No
stenosis or atherosclerosis.

Vertebral arteries: No proximal subclavian stenosis. Mild dominance
of the right vertebral artery. Both vertebral arteries are smooth
and widely patent.

Skeleton: Negative.  No explanation for neck pain.

Other neck: No evidence of mass or inflammation

Upper chest: Negative

Review of the MIP images confirms the above findings

CTA HEAD FINDINGS

Anterior circulation: Distal medium vessel visualization is limited
by venous contamination. No stenosis, beading, aneurysm, or branch
occlusion.

Posterior circulation: Mild right vertebral artery dominance. The
vertebral and basilar arteries are smooth and widely patent. No
branch occlusion, beading, or aneurysm.

Venous sinuses: Patent

Anatomic variants: None significant

Delayed phase: No abnormal intracranial enhancement

Review of the MIP images confirms the above findings
IMPRESSION: Negative CTA of the head and neck.

## 2020-08-30 ENCOUNTER — Other Ambulatory Visit: Payer: Self-pay

## 2020-08-30 ENCOUNTER — Encounter (HOSPITAL_COMMUNITY): Payer: Self-pay | Admitting: Emergency Medicine

## 2020-08-30 ENCOUNTER — Ambulatory Visit (HOSPITAL_COMMUNITY)
Admission: EM | Admit: 2020-08-30 | Discharge: 2020-08-30 | Disposition: A | Discharge status: Home / Self Care | Payer: Medicaid Other | Attending: Emergency Medicine | Admitting: Emergency Medicine

## 2020-08-30 DIAGNOSIS — N898 Other specified noninflammatory disorders of vagina: Secondary | ICD-10-CM | POA: Diagnosis not present

## 2020-08-30 DIAGNOSIS — Z113 Encounter for screening for infections with a predominantly sexual mode of transmission: Secondary | ICD-10-CM | POA: Diagnosis not present

## 2020-08-30 NOTE — Discharge Instructions (Signed)
We will contact you with the results from your lab work and any additional treatment.    Do not have sex while taking undergoing treatment for STI.  Make sure that all of your partners get tested and treated.   Use a condom or other barrier method for all sexual encounters.    Return or go to the Emergency Department if symptoms worsen or do not improve in the next few days.   

## 2020-08-30 NOTE — ED Provider Notes (Signed)
[facility]    CSN: [id number] Arrival date & time: 08/30/20  1722      History   Chief Complaint Chief Complaint  Patient presents with  . Vaginal Discharge  . Vaginal Itching    HPI Diamond Bolton is a 30 y.o. female.   Patient here for evaluation of vaginal itching and discharge.  Concern for possible STI but no known exposure.  Denies any odor or vaginal pain.  Has not tried any OTC medications or treatments.  Reports similar symptoms in the past when diagnosed with track but denies any associated odors.  Denies any specific alleviating or aggravating factors.  Denies any fevers, chest pain, shortness of breath, N/V/D, numbness, tingling, weakness, abdominal pain, or headaches.   ROS: As per HPI, all other pertinent ROS negative   The history is provided by the patient.  Vaginal Discharge Associated symptoms: vaginal itching   Vaginal Itching    Past Medical History:  Diagnosis Date  . Asthma   . Urticaria     Patient Active Problem List   Diagnosis Date Noted  . S/P cesarean section 09/09/2013  . Personal history of previous postdates pregnancy 09/07/2013    Past Surgical History:  Procedure Laterality Date  . addenoidectomy    . ANKLE FRACTURE SURGERY Right 09/18/2014  . CESAREAN SECTION N/A 09/09/2013   Procedure: CESAREAN SECTION;  Surgeon: Levie Heritage, DO;  Location: [facility];  Service: Gynecology;  Laterality: N/A;  . NO PAST SURGERIES    . TONSILLECTOMY    . WISDOM TOOTH EXTRACTION      OB History    Gravida  1   Para  1   Term  1   Preterm      AB      Living  1     SAB      IAB      Ectopic      Multiple      Live Births  1            Home Medications    Prior to Admission medications   Medication Sig Start Date End Date Taking? Authorizing Provider  cetirizine (ZYRTEC) 10 MG tablet Take 1 tablet (10 mg total) by mouth daily. 09/02/17   Marcelyn Bruins, MD  cyclobenzaprine (FLEXERIL) 10 MG  tablet Take 1 tablet (10 mg total) by mouth 2 (two) times daily as needed for muscle spasms. 08/06/18   Hedges, Tinnie Gens, PA-C  diphenhydrAMINE (BENADRYL) 25 MG tablet Take 25 mg by mouth every 6 (six) hours as needed.    [provider]  megestrol (MEGACE) 40 MG tablet Take 1 tablet (40 mg total) by mouth 2 (two) times daily. For 10 days. Then, take daily until seen by a provider. 04/04/20   Gita Kudo, MD  montelukast (SINGULAIR) 10 MG tablet Take 1 tablet (10 mg total) by mouth at bedtime. 09/02/17   Marcelyn Bruins, MD  Olopatadine HCl (PATADAY) 0.2 % SOLN Place 1 drop into both eyes daily as needed. 09/02/17   Marcelyn Bruins, MD  ranitidine (ZANTAC) 150 MG tablet Take 1 tablet (150 mg total) by mouth 2 (two) times daily. 09/02/17   Marcelyn Bruins, MD    Family History Family History  Problem Relation Age of Onset  . Thyroid disease Mother   . Varicose Veins Mother   . Diabetes Sister   . Varicose Veins Sister   . Cancer Maternal Grandmother  breast  . Hypertension Maternal Aunt   . Hypertension Maternal Uncle   . Cancer Maternal Grandfather        lung and brain    Social History Social History   Tobacco Use  . Smoking status: Current Every Day Smoker    Packs/day: 0.25    Years: 5.00    Pack years: 1.25    Types: Cigarettes    Last attempt to quit: 01/11/2013    Years since quitting: 7.6  . Smokeless tobacco: Never Used  Substance Use Topics  . Alcohol use: Yes    Comment: occassionally  . Drug use: Yes    Types: Marijuana     Allergies   Patient has no known allergies.   Review of Systems Review of Systems  Genitourinary: Positive for vaginal discharge.  All other systems reviewed and are negative.    Physical Exam Triage Vital Signs ED Triage Vitals  Enc Vitals Group     BP 08/30/20 1807 119/78     Pulse Rate 08/30/20 1807 71     Resp 08/30/20 1807 16     Temp 08/30/20 1807 99.5 F (37.5 C)     Temp  Source 08/30/20 1807 Oral     SpO2 08/30/20 1807 98 %     Weight --      Height --      Head Circumference --      Peak Flow --      Pain Score 08/30/20 1806 0     Pain Loc --      Pain Edu? --      Excl. in GC? --    No data found.  Updated Vital Signs BP 119/78 (BP Location: Right Arm)   Pulse 71   Temp 99.5 F (37.5 C) (Oral)   Resp 16   LMP 08/09/2020   SpO2 98%   Visual Acuity Right Eye Distance:   Left Eye Distance:   Bilateral Distance:    Right Eye Near:   Left Eye Near:    Bilateral Near:     Physical Exam Vitals and nursing note reviewed.  Constitutional:      General: She is not in acute distress.    Appearance: Normal appearance. She is not ill-appearing, toxic-appearing or diaphoretic.  HENT:     Head: Normocephalic and atraumatic.  Eyes:     Conjunctiva/sclera: Conjunctivae normal.  Cardiovascular:     Rate and Rhythm: Normal rate.     Pulses: Normal pulses.  Pulmonary:     Effort: Pulmonary effort is normal.  Abdominal:     General: Abdomen is flat.  Genitourinary:    Comments: declines Musculoskeletal:        General: Normal range of motion.     Cervical back: Normal range of motion.  Skin:    General: Skin is warm and dry.  Neurological:     General: No focal deficit present.     Mental Status: She is alert and oriented to person, place, and time.  Psychiatric:        Mood and Affect: Mood normal.      UC Treatments / Results  Labs (all labs ordered are listed, but only abnormal results are displayed) Labs Reviewed  CERVICOVAGINAL ANCILLARY ONLY    EKG   Radiology No results found.  Procedures Procedures (including critical care time)  Medications Ordered in UC Medications - No data to display  Initial Impression / Assessment and Plan / UC Course  I have reviewed  the triage vital signs and the nursing notes.  Pertinent labs & imaging results that were available during my care of the patient were reviewed by me and  considered in my medical decision making (see chart for details).    Vaginal itching, discharge Screen for STD Assessment negative for red flags or concerns. Self swab obtained.  Will treat based on results. Safe sex practices including condoms or other barrier methods discussed. Follow-up with PCP as needed  Final Clinical Impressions(s) / UC Diagnoses   Final diagnoses:  Vaginal itching  Vaginal discharge  Screening for STD (sexually transmitted disease)     Discharge Instructions     We will contact you with the results from your lab work and any additional treatment.     Do not have sex while taking undergoing treatment for STI.  Make sure that all of your partners get tested and treated.   Use a condom or other barrier method for all sexual encounters.    Return or go to the Emergency Department if symptoms worsen or do not improve in the next few days.     ED Prescriptions    None     PDMP not reviewed this encounter.   Ivette Loyal, NP 08/30/20 [phone]

## 2020-09-01 LAB — CERVICOVAGINAL ANCILLARY ONLY
Bacterial Vaginitis (gardnerella): POSITIVE — AB
Candida Glabrata: NEGATIVE
Candida Vaginitis: NEGATIVE
Chlamydia: NEGATIVE
Comment: NEGATIVE
Comment: NEGATIVE
Comment: NEGATIVE
Comment: NEGATIVE
Comment: NEGATIVE
Comment: NORMAL
Neisseria Gonorrhea: NEGATIVE
Trichomonas: POSITIVE — AB

## 2020-09-02 ENCOUNTER — Telehealth (HOSPITAL_COMMUNITY): Payer: Self-pay | Admitting: Emergency Medicine

## 2020-09-02 MED ORDER — METRONIDAZOLE 500 MG PO TABS: 500.0000 mg | ORAL_TABLET | Freq: Two times a day (BID) | ORAL | 0 refills | Status: DC

## 2020-11-12 NOTE — Addendum Note (Signed)
Encounter addended by: Fayette Pho, MD on: 11/12/2020 11:00 AM  Actions taken: Letter saved

## 2021-06-17 ENCOUNTER — Other Ambulatory Visit: Payer: Self-pay

## 2021-06-17 ENCOUNTER — Ambulatory Visit (HOSPITAL_COMMUNITY)
Admission: EM | Admit: 2021-06-17 | Discharge: 2021-06-17 | Disposition: A | Discharge status: Home / Self Care | Payer: Medicaid Other | Attending: Family Medicine | Admitting: Family Medicine

## 2021-06-17 ENCOUNTER — Encounter (HOSPITAL_COMMUNITY): Payer: Self-pay | Admitting: *Deleted

## 2021-06-17 DIAGNOSIS — Z3A01 Less than 8 weeks gestation of pregnancy: Secondary | ICD-10-CM

## 2021-06-17 DIAGNOSIS — Z3201 Encounter for pregnancy test, result positive: Secondary | ICD-10-CM

## 2021-06-17 LAB — POC URINE PREG, ED: Preg Test, Ur: POSITIVE — AB

## 2021-06-17 NOTE — ED Provider Notes (Signed)
[facility]    CSN: [id number] Arrival date & time: 06/17/21  1101      History   Chief Complaint Chief Complaint  Patient presents with   Possible Pregnancy    HPI Diamond Bolton is a 31 y.o. female.    Possible Pregnancy  Here for missed period.  Last normal menses began December 21.  She has not had any nausea or vomiting.  No breast tenderness noted.  But she has had some bloating for the last 10 days or so.  No fever or chills  Pregnancy test here is positive  Past Medical History:  Diagnosis Date   Asthma    Urticaria     Patient Active Problem List   Diagnosis Date Noted   S/P cesarean section 09/09/2013   Personal history of previous postdates pregnancy 09/07/2013    Past Surgical History:  Procedure Laterality Date   addenoidectomy     ANKLE FRACTURE SURGERY Right 09/18/2014   CESAREAN SECTION N/A 09/09/2013   Procedure: CESAREAN SECTION;  Surgeon: Levie Heritage, DO;  Location: [facility];  Service: Gynecology;  Laterality: N/A;   NO PAST SURGERIES     TONSILLECTOMY     WISDOM TOOTH EXTRACTION      OB History     Gravida  1   Para  1   Term  1   Preterm      AB      Living  1      SAB      IAB      Ectopic      Multiple      Live Births  1            Home Medications    Prior to Admission medications   Medication Sig Start Date End Date Taking? Authorizing Provider  cetirizine (ZYRTEC) 10 MG tablet Take 1 tablet (10 mg total) by mouth daily. 09/02/17   Marcelyn Bruins, MD  diphenhydrAMINE (BENADRYL) 25 MG tablet Take 25 mg by mouth every 6 (six) hours as needed.    [provider]  montelukast (SINGULAIR) 10 MG tablet Take 1 tablet (10 mg total) by mouth at bedtime. 09/02/17   Marcelyn Bruins, MD  Olopatadine HCl (PATADAY) 0.2 % SOLN Place 1 drop into both eyes daily as needed. 09/02/17   Marcelyn Bruins, MD  ranitidine (ZANTAC) 150 MG tablet Take 1 tablet (150 mg total) by  mouth 2 (two) times daily. 09/02/17   Marcelyn Bruins, MD    Family History Family History  Problem Relation Age of Onset   Thyroid disease Mother    Varicose Veins Mother    Diabetes Sister    Varicose Veins Sister    Cancer Maternal Grandmother        breast   Hypertension Maternal Aunt    Hypertension Maternal Uncle    Cancer Maternal Grandfather        lung and brain    Social History Social History   Tobacco Use   Smoking status: Every Day    Packs/day: 0.25    Years: 5.00    Pack years: 1.25    Types: Cigarettes    Last attempt to quit: 01/11/2013    Years since quitting: 8.4   Smokeless tobacco: Never  Substance Use Topics   Alcohol use: Yes    Comment: occassionally   Drug use: Yes    Types: Marijuana     Allergies   Patient has  no known allergies.   Review of Systems Review of Systems   Physical Exam Triage Vital Signs ED Triage Vitals  Enc Vitals Group     BP 06/17/21 1109 123/82     Pulse Rate 06/17/21 1109 92     Resp 06/17/21 1109 18     Temp 06/17/21 1109 98.2 F (36.8 C)     Temp src --      SpO2 06/17/21 1109 98 %     Weight --      Height --      Head Circumference --      Peak Flow --      Pain Score 06/17/21 1108 0     Pain Loc --      Pain Edu? --      Excl. in GC? --    No data found.  Updated Vital Signs BP 123/82    Pulse 92    Temp 98.2 F (36.8 C)    Resp 18    LMP 05/21/2021    SpO2 98%   Visual Acuity Right Eye Distance:   Left Eye Distance:   Bilateral Distance:    Right Eye Near:   Left Eye Near:    Bilateral Near:     Physical Exam Vitals reviewed.  Constitutional:      General: She is not in acute distress. Cardiovascular:     Rate and Rhythm: Normal rate and regular rhythm.  Neurological:     Mental Status: She is alert and oriented to person, place, and time.  Psychiatric:        Behavior: Behavior normal.     UC Treatments / Results  Labs (all labs ordered are listed, but only  abnormal results are displayed) Labs Reviewed  POC URINE PREG, ED - Abnormal; Notable for the following components:      Result Value   Preg Test, Ur POSITIVE (*)    All other components within normal limits    EKG   Radiology No results found.  Procedures Procedures (including critical care time)  Medications Ordered in UC Medications - No data to display  Initial Impression / Assessment and Plan / UC Course  I have reviewed the triage vital signs and the nursing notes.  Pertinent labs & imaging results that were available during my care of the patient were reviewed by me and considered in my medical decision making (see chart for details).     Discussed that most likely she is [redacted] weeks pregnant, and how we count the weeks in medicine from the period, and not from conception.  Final Clinical Impressions(s) / UC Diagnoses   Final diagnoses:  Less than [redacted] weeks gestation of pregnancy     Discharge Instructions      Your pregnancy test was positive.  Tylenol may be taken for pain or fever.  Avoid alcohol, ibuprofen, and aspirin.  Call the [facility] as soon as you can for an appointment     ED Prescriptions   None    PDMP not reviewed this encounter.   Zenia Resides, MD 06/17/21 [phone]

## 2021-06-17 NOTE — ED Triage Notes (Signed)
P thad a positive home pregnancy test today.

## 2021-06-17 NOTE — Discharge Instructions (Addendum)
Your pregnancy test was positive.  Tylenol may be taken for pain or fever.  Avoid alcohol, ibuprofen, and aspirin.  Call the [facility] as soon as you can for an appointment

## 2021-12-27 ENCOUNTER — Other Ambulatory Visit: Payer: Self-pay

## 2021-12-27 ENCOUNTER — Encounter (HOSPITAL_COMMUNITY): Payer: Self-pay

## 2021-12-27 ENCOUNTER — Emergency Department (HOSPITAL_COMMUNITY)
Admission: EM | Admit: 2021-12-27 | Discharge: 2021-12-27 | Disposition: A | Discharge status: Home / Self Care | Payer: Medicaid Other | Attending: Emergency Medicine | Admitting: Emergency Medicine

## 2021-12-27 DIAGNOSIS — L292 Pruritus vulvae: Secondary | ICD-10-CM | POA: Diagnosis not present

## 2021-12-27 DIAGNOSIS — N898 Other specified noninflammatory disorders of vagina: Secondary | ICD-10-CM

## 2021-12-27 DIAGNOSIS — A599 Trichomoniasis, unspecified: Secondary | ICD-10-CM

## 2021-12-27 LAB — URINALYSIS, ROUTINE W REFLEX MICROSCOPIC
Bacteria, UA: NONE SEEN
Bilirubin Urine: NEGATIVE
Glucose, UA: NEGATIVE mg/dL
Ketones, ur: NEGATIVE mg/dL
Nitrite: NEGATIVE
Protein, ur: NEGATIVE mg/dL
Specific Gravity, Urine: 1.017 (ref 1.005–1.030)
pH: 8 (ref 5.0–8.0)

## 2021-12-27 LAB — WET PREP, GENITAL
Clue Cells Wet Prep HPF POC: NONE SEEN
Sperm: NONE SEEN
WBC, Wet Prep HPF POC: <10 (ref <10)
Yeast Wet Prep HPF POC: NONE SEEN

## 2021-12-27 MED ORDER — METRONIDAZOLE 500 MG PO TABS
500.0000 mg | ORAL_TABLET | Freq: Two times a day (BID) | ORAL | 0 refills | Status: AC
  Filled 2021-12-27: qty 14, 7d supply, fill #0

## 2021-12-27 NOTE — ED Provider Notes (Signed)
[facility]-EMERGENCY DEPT Provider Note   CSN: [id number] Arrival date & time: 12/27/21  8315     History  Chief Complaint  Patient presents with   Vaginal Itching    Diamond Bolton is a 31 y.o. female who presents the emergency department for vaginal itching with concern for either trichomonas infection or yeast infection.  States symptoms have been going on for several days.  No pain.   Vaginal Itching       Home Medications Prior to Admission medications   Medication Sig Start Date End Date Taking? Authorizing Provider  metroNIDAZOLE (FLAGYL) 500 MG tablet Take 1 tablet (500 mg total) by mouth 2 (two) times daily for 7 days. 12/27/21 01/03/22 Yes Toini Failla T, PA-C  cetirizine (ZYRTEC) 10 MG tablet Take 1 tablet (10 mg total) by mouth daily. 09/02/17   Marcelyn Bruins, MD  diphenhydrAMINE (BENADRYL) 25 MG tablet Take 25 mg by mouth every 6 (six) hours as needed.    [provider]  montelukast (SINGULAIR) 10 MG tablet Take 1 tablet (10 mg total) by mouth at bedtime. 09/02/17   Marcelyn Bruins, MD  Olopatadine HCl (PATADAY) 0.2 % SOLN Place 1 drop into both eyes daily as needed. 09/02/17   Marcelyn Bruins, MD  ranitidine (ZANTAC) 150 MG tablet Take 1 tablet (150 mg total) by mouth 2 (two) times daily. 09/02/17   Marcelyn Bruins, MD      Allergies    Patient has no known allergies.    Review of Systems   Review of Systems  Genitourinary:  Positive for vaginal discharge. Negative for dysuria, vaginal bleeding and vaginal pain.       Vaginal itching  All other systems reviewed and are negative.   Physical Exam Updated Vital Signs BP (!) 131/95 (BP Location: Left Arm)   Pulse 82   Temp 98.1 F (36.7 C) (Oral)   Resp 18   Ht 5\' 6"  (1.676 m)   Wt 97.5 kg   LMP 12/19/2021 (Exact Date)   SpO2 100%   BMI 34.70 kg/m  Physical Exam Vitals and nursing note reviewed.  Constitutional:       Appearance: Normal appearance.  HENT:     Head: Normocephalic and atraumatic.  Eyes:     Conjunctiva/sclera: Conjunctivae normal.  Pulmonary:     Effort: Pulmonary effort is normal. No respiratory distress.  Genitourinary:    Comments: Deferred, patient self swabbed Skin:    General: Skin is warm and dry.  Neurological:     Mental Status: She is alert.  Psychiatric:        Mood and Affect: Mood normal.        Behavior: Behavior normal.     ED Results / Procedures / Treatments   Labs (all labs ordered are listed, but only abnormal results are displayed) Labs Reviewed  WET PREP, GENITAL - Abnormal; Notable for the following components:      Result Value   Trich, Wet Prep PRESENT (*)    All other components within normal limits  URINALYSIS, ROUTINE W REFLEX MICROSCOPIC - Abnormal; Notable for the following components:   APPearance CLOUDY (*)    Hgb urine dipstick SMALL (*)    Leukocytes,Ua MODERATE (*)    All other components within normal limits  GC/CHLAMYDIA PROBE AMP ([place]) NOT AT [facility]    EKG None  Radiology No results found.  Procedures Procedures    Medications Ordered in ED Medications - No data  to display  ED Course/ Medical Decision Making/ A&P                           Medical Decision Making Amount and/or Complexity of Data Reviewed Labs: ordered.  Risk Prescription drug management.   Patient is an otherwise healthy 31 year old female who presents the emergency department for vaginal itching.  Patient is clinically well-appearing, no acute distress.  Normal vital signs.  Patient elected to self swab.  G/C testing sent, results pending.  Wet prep positive for trichomonas.  Urinalysis negative for UTI.  Discussed the results with the patient.  Will discharge home with prescription for metronidazole and recommend follow-up with primary doctor.  Discussed reasons to return the emergency department, and patient is agreeable to the  plan.        Final Clinical Impression(s) / ED Diagnoses Final diagnoses:  Vaginal itching  Trichomonas infection    Rx / DC Orders ED Discharge Orders          Ordered    metroNIDAZOLE (FLAGYL) 500 MG tablet  2 times daily        12/27/21 2328           Portions of this report may have been transcribed using voice recognition software. Every effort was made to ensure accuracy; however, inadvertent computerized transcription errors may be present.    Jeanella Flattery 12/27/21 2344    Pricilla Loveless, MD 12/29/21 2251

## 2021-12-27 NOTE — ED Triage Notes (Signed)
Pt sts she eit her has trich or a yeast infection. Requesting evaluation.

## 2021-12-27 NOTE — Discharge Instructions (Addendum)
You came to the emergency department today for vaginal itching.  We have had you swab yourself for gonorrhea, chlamydia, and these results are pending.  You can sign up for [facility] MyChart to access your medical records and test results using this link: [email or web address]  You tested positive for trichomonas. I've sent in the antibiotic for you.   If any of your other results are positive, please follow up at the health department for treatment.   [facility] Department  [address] 2nd floor [place] [address]  Continue to monitor how you're doing and return to the ER for new or worsening symptoms.

## 2021-12-29 ENCOUNTER — Other Ambulatory Visit (HOSPITAL_COMMUNITY): Payer: Self-pay

## 2021-12-29 LAB — GC/CHLAMYDIA PROBE AMP (~~LOC~~) NOT AT ARMC
Chlamydia: POSITIVE — AB
Comment: NEGATIVE
Comment: NORMAL
Neisseria Gonorrhea: NEGATIVE

## 2021-12-30 ENCOUNTER — Other Ambulatory Visit (HOSPITAL_COMMUNITY): Payer: Self-pay

## 2021-12-30 ENCOUNTER — Telehealth: Payer: Medicaid Other | Admitting: Physician Assistant

## 2021-12-30 DIAGNOSIS — A749 Chlamydial infection, unspecified: Secondary | ICD-10-CM | POA: Diagnosis not present

## 2021-12-30 MED ORDER — DOXYCYCLINE HYCLATE 100 MG PO CAPS
100.0000 mg | ORAL_CAPSULE | Freq: Two times a day (BID) | ORAL | 0 refills | Status: AC
  Filled 2021-12-30: qty 14, 7d supply, fill #0

## 2021-12-30 NOTE — Progress Notes (Signed)
Virtual Visit Consent   [name], you are scheduled for a virtual visit with a [facility] provider today. Just as with appointments in the office, your consent must be obtained to participate. Your consent will be active for this visit and any virtual visit you may have with one of our providers in the next 365 days. If you have a MyChart account, a copy of this consent can be sent to you electronically.  As this is a virtual visit, video technology does not allow for your provider to perform a traditional examination. This may limit your provider's ability to fully assess your condition. If your provider identifies any concerns that need to be evaluated in person or the need to arrange testing (such as labs, EKG, etc.), we will make arrangements to do so. Although advances in technology are sophisticated, we cannot ensure that it will always work on either your end or our end. If the connection with a video visit is poor, the visit may have to be switched to a telephone visit. With either a video or telephone visit, we are not always able to ensure that we have a secure connection.  By engaging in this virtual visit, you consent to the provision of healthcare and authorize for your insurance to be billed (if applicable) for the services provided during this visit. Depending on your insurance coverage, you may receive a charge related to this service.  I need to obtain your verbal consent now. Are you willing to proceed with your visit today? Diamond Bolton has provided verbal consent on 12/30/2021 for a virtual visit (video or telephone). [name], [place]  Date: 12/30/2021 2:05 PM  Virtual Visit via Video Note   I, [name], connected with  Diamond Bolton  ([id number], [birth year]) on 12/30/21 at  2:00 PM EDT by a video-enabled telemedicine application and verified that I am speaking with the correct person using two identifiers.  Location: Patient: Virtual Visit  Location Patient: Home Provider: Virtual Visit Location Provider: Home Office   I discussed the limitations of evaluation and management by telemedicine and the availability of in person appointments. The patient expressed understanding and agreed to proceed.    History of Present Illness: Diamond Bolton is a 31 y.o. who identifies as a female who was assigned female at birth, and is being seen today for questions about positive chlamydia test results from her ER visit on 7/29. At that time was seen for some vaginal itch and discharge. Suspected trichomoniasis and this came back positive on screen so was started on Flagyl which she has started taking. GC testing resulted last night and negative for gonorrhea but positive for chlamydia. Has not heard from the ER and wanted to speak to someone about treatment and follow-up. Denies any pelvic or abdominal pain, fever, chills, malaise. No current vaginal discharge.   HPI: HPI  Problems:  Patient Active Problem List   Diagnosis Date Noted   S/P cesarean section 09/09/2013   Personal history of previous postdates pregnancy 09/07/2013    Allergies: No Known Allergies Medications:  Current Outpatient Medications:    doxycycline (VIBRAMYCIN) 100 MG capsule, Take 1 capsule (100 mg total) by mouth 2 (two) times daily., Disp: 14 capsule, Rfl: 0   cetirizine (ZYRTEC) 10 MG tablet, Take 1 tablet (10 mg total) by mouth daily., Disp: 30 tablet, Rfl: 5   diphenhydrAMINE (BENADRYL) 25 MG tablet, Take 25 mg by mouth every 6 (six) hours as needed., Disp: ,  Rfl:    metroNIDAZOLE (FLAGYL) 500 MG tablet, Take 1 tablet (500 mg total) by mouth 2 (two) times daily for 7 days., Disp: 14 tablet, Rfl: 0   montelukast (SINGULAIR) 10 MG tablet, Take 1 tablet (10 mg total) by mouth at bedtime., Disp: 30 tablet, Rfl: 5   Olopatadine HCl (PATADAY) 0.2 % SOLN, Place 1 drop into both eyes daily as needed., Disp: 1 Bottle, Rfl: 5   ranitidine (ZANTAC) 150 MG tablet, Take 1  tablet (150 mg total) by mouth 2 (two) times daily., Disp: 60 tablet, Rfl: 5  Observations/Objective: Patient is well-developed, well-nourished in no acute distress.  Resting comfortably at home.  Head is normocephalic, atraumatic.  No labored breathing. Speech is clear and coherent with logical content.  Patient is alert and oriented at baseline.    Assessment and Plan: 1. Chlamydia - doxycycline (VIBRAMYCIN) 100 MG capsule; Take 1 capsule (100 mg total) by mouth 2 (two) times daily.  Dispense: 14 capsule; Refill: 0  Positive result. Will start Doxycycline BID x 7 days. She needs to complete full course of Flagyl for her Trichomoniasis. Discussed need to refrain from any activity until (1) treatment is completed, (2) symptoms resolved and (3) repeat testing confirms cure. She also needs full STI Panel to rule out any concern for RPR or HIV giving higher risk. Discussed safe sex practices. She is without a PCP. Resources given so she can set this up but can follow-up at in-person UC in 10-14 days for reassessment.   Follow Up Instructions: I discussed the assessment and treatment plan with the patient. The patient was provided an opportunity to ask questions and all were answered. The patient agreed with the plan and demonstrated an understanding of the instructions.  A copy of instructions were sent to the patient via MyChart unless otherwise noted below.   The patient was advised to call back or seek an in-person evaluation if the symptoms worsen or if the condition fails to improve as anticipated.  Time:  I spent 10 minutes with the patient via telehealth technology discussing the above problems/concerns.    [name], PA-C

## 2021-12-30 NOTE — Patient Instructions (Signed)
  Marquis Buggy, thank you for joining [name], PA-C for today's virtual visit.  While this provider is not your primary care provider (PCP), if your PCP is located in our provider database this encounter information will be shared with them immediately following your visit.  Consent: (Patient) Diamond Bolton provided verbal consent for this virtual visit at the beginning of the encounter.  Current Medications:  Current Outpatient Medications:    doxycycline (VIBRAMYCIN) 100 MG capsule, Take 1 capsule (100 mg total) by mouth 2 (two) times daily., Disp: 14 capsule, Rfl: 0   cetirizine (ZYRTEC) 10 MG tablet, Take 1 tablet (10 mg total) by mouth daily., Disp: 30 tablet, Rfl: 5   diphenhydrAMINE (BENADRYL) 25 MG tablet, Take 25 mg by mouth every 6 (six) hours as needed., Disp: , Rfl:    metroNIDAZOLE (FLAGYL) 500 MG tablet, Take 1 tablet (500 mg total) by mouth 2 (two) times daily for 7 days., Disp: 14 tablet, Rfl: 0   montelukast (SINGULAIR) 10 MG tablet, Take 1 tablet (10 mg total) by mouth at bedtime., Disp: 30 tablet, Rfl: 5   Olopatadine HCl (PATADAY) 0.2 % SOLN, Place 1 drop into both eyes daily as needed., Disp: 1 Bottle, Rfl: 5   ranitidine (ZANTAC) 150 MG tablet, Take 1 tablet (150 mg total) by mouth 2 (two) times daily., Disp: 60 tablet, Rfl: 5   Medications ordered in this encounter:  Meds ordered this encounter  Medications   doxycycline (VIBRAMYCIN) 100 MG capsule    Sig: Take 1 capsule (100 mg total) by mouth 2 (two) times daily.    Dispense:  14 capsule    Refill:  0    Order Specific Question:   Supervising Provider    Answer:   Hyacinth Meeker, BRIAN [[id number]]     *If you need refills on other medications prior to your next appointment, please contact your pharmacy*  Follow-Up: Call back or seek an in-person evaluation if the symptoms worsen or if the condition fails to improve as anticipated.  Other Instructions Please complete course of the metronidazole  (flagyl) for the trich.  Take the Doxycycline as directed for the positive chlamydia test.  Refrain from any sexual activity until treatment is completed, symptoms resolved and you have been retested. You need a full STI panel including HIV and syphilis screens giving your recent risks.  Always limit sexual partners, know partner's history and be consistent with condom use to help prevent future STI.      If you have been instructed to have an in-person evaluation today at a local Urgent Care facility, please use the link below. It will take you to a list of all of our available [facility], including address, phone number and hours of operation. Please do not delay care.  [facility]  If you or a family member do not have a primary care provider, use the link below to schedule a visit and establish care. When you choose a [facility] primary care physician or advanced practice provider, you gain a long-term partner in health. Find a Primary Care Provider  Learn more about [facility] [place]'s in-office and virtual care options: [facility] - Get Care Now

## 2022-04-13 ENCOUNTER — Encounter (HOSPITAL_COMMUNITY): Payer: Self-pay

## 2022-04-13 ENCOUNTER — Other Ambulatory Visit: Payer: Self-pay

## 2022-04-13 ENCOUNTER — Emergency Department (HOSPITAL_COMMUNITY)
Admission: EM | Admit: 2022-04-13 | Discharge: 2022-04-13 | Discharge status: Against Medical Advice | Payer: Medicaid Other | Attending: Emergency Medicine | Admitting: Emergency Medicine

## 2022-04-13 DIAGNOSIS — R140 Abdominal distension (gaseous): Secondary | ICD-10-CM | POA: Insufficient documentation

## 2022-04-13 DIAGNOSIS — R350 Frequency of micturition: Secondary | ICD-10-CM | POA: Insufficient documentation

## 2022-04-13 DIAGNOSIS — Z3202 Encounter for pregnancy test, result negative: Secondary | ICD-10-CM | POA: Diagnosis not present

## 2022-04-13 DIAGNOSIS — Z5321 Procedure and treatment not carried out due to patient leaving prior to being seen by health care provider: Secondary | ICD-10-CM | POA: Insufficient documentation

## 2022-04-13 LAB — URINALYSIS, ROUTINE W REFLEX MICROSCOPIC
Bilirubin Urine: NEGATIVE
Glucose, UA: NEGATIVE mg/dL
Hgb urine dipstick: NEGATIVE
Ketones, ur: NEGATIVE mg/dL
Leukocytes,Ua: NEGATIVE
Nitrite: NEGATIVE
Protein, ur: NEGATIVE mg/dL
Specific Gravity, Urine: 1.029 (ref 1.005–1.030)
pH: 6 (ref 5.0–8.0)

## 2022-04-13 LAB — I-STAT BETA HCG BLOOD, ED (MC, WL, AP ONLY): I-stat hCG, quantitative: <5 m[IU]/mL (ref <5)

## 2022-04-13 NOTE — ED Triage Notes (Signed)
Pt c/o increased urinary frequency and sleeping and is concerned she may be pregnant. Pt requesting a pregnancy test, states she has taken 2 at home and were negative.

## 2022-04-13 NOTE — ED Provider Triage Note (Signed)
Emergency Medicine Provider Triage Evaluation Note  Diamond Bolton , a 31 y.o. female  was evaluated in triage.  Patient requested pregnancy test after 2 negative tests at home.  She states she has been having increase in urinary frequency, sleeping and her abdomen felt bloated in the last 2 weeks.  Patient is sexually active.  LMP 2 weeks ago.  Review of Systems  Positive: As above Negative: As above  Physical Exam  BP 137/88   Pulse 66   Temp 98.4 F (36.9 C)   Resp 18   SpO2 99%  Gen:   Awake, no distress   Resp:  Normal effort  MSK:   Moves extremities without difficulty  Other:    Medical Decision Making  Medically screening exam initiated at 4:27 PM.  Appropriate orders placed.  Diamond Bolton was informed that the remainder of the evaluation will be completed by another provider, this initial triage assessment does not replace that evaluation, and the importance of remaining in the ED until their evaluation is complete.     Diamond Bolton, [place] 04/14/22 [phone]

## 2022-06-03 IMAGING — US US PELVIS COMPLETE WITH TRANSVAGINAL
1 series · 14 of 25 positions shown · non-contrast
Comparison: Prior obstetrical ultrasounds 09/04/2013

CLINICAL DATA: Heavy vaginal bleeding for 1 month

EXAM:
TRANSABDOMINAL AND TRANSVAGINAL ULTRASOUND OF PELVIS
TECHNIQUE: Both transabdominal and transvaginal ultrasound examinations of the
pelvis were performed. Transabdominal technique was performed for
global imaging of the pelvis including uterus, ovaries, adnexal
regions, and pelvic cul-de-sac. It was necessary to proceed with
endovaginal exam following the transabdominal exam to visualize the
endometrium and ovaries.

[Series 1: us pelvis complete with transvaginal · 14 of 112 slices shown]
[im 1/112]
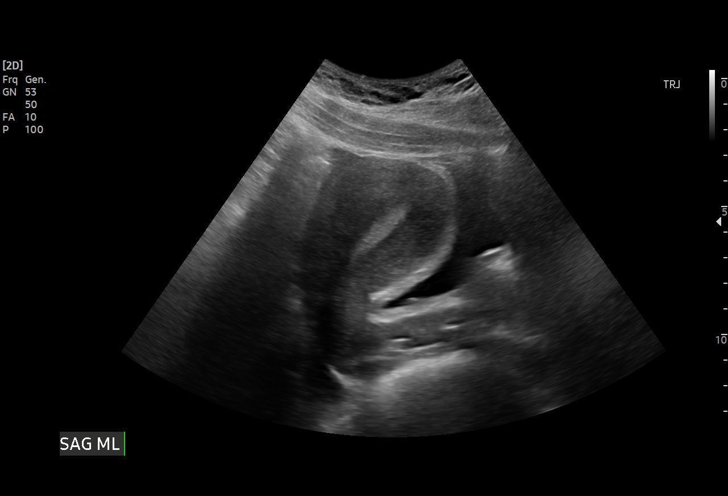
[im 10/112]
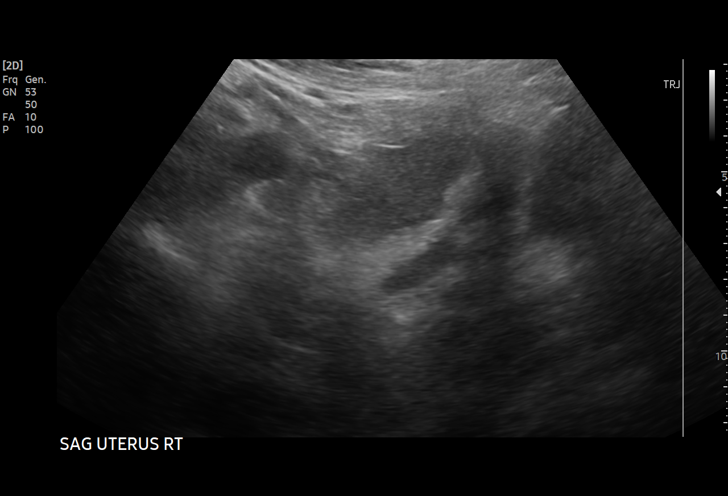
[im 19/112]
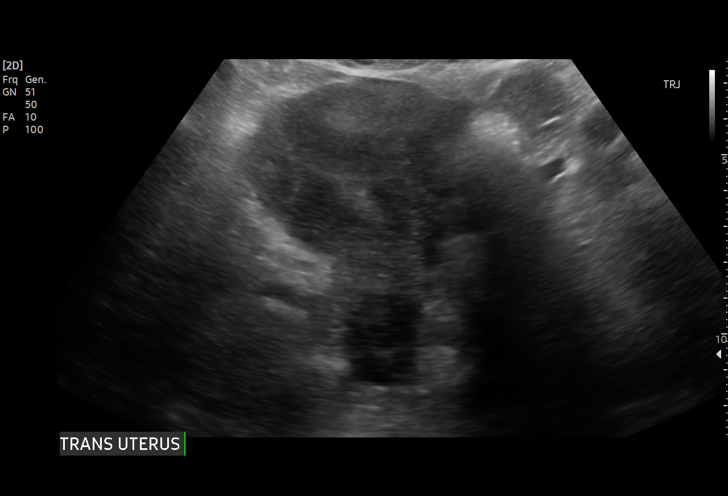
[im 28/112]
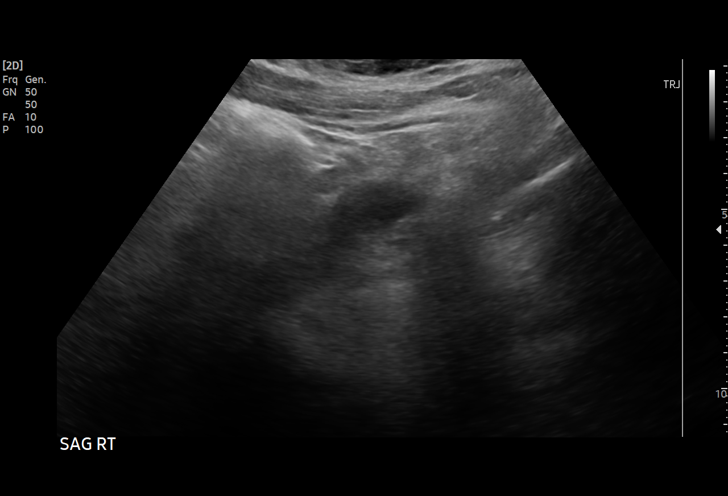
[im 38/112]
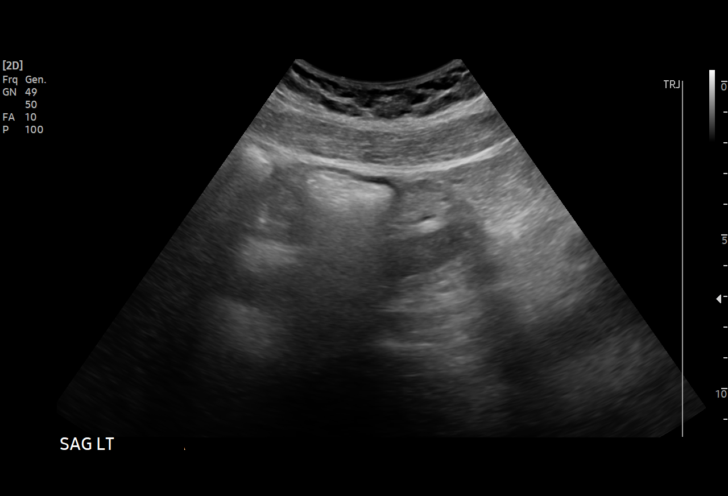
[im 42/112]
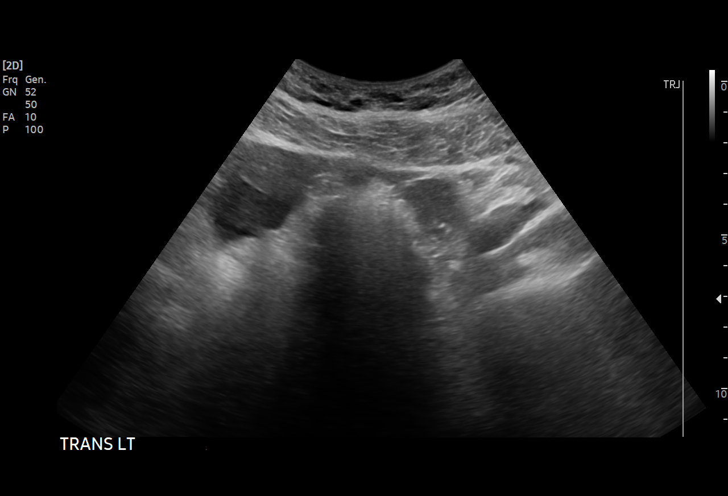
[im 51/112]
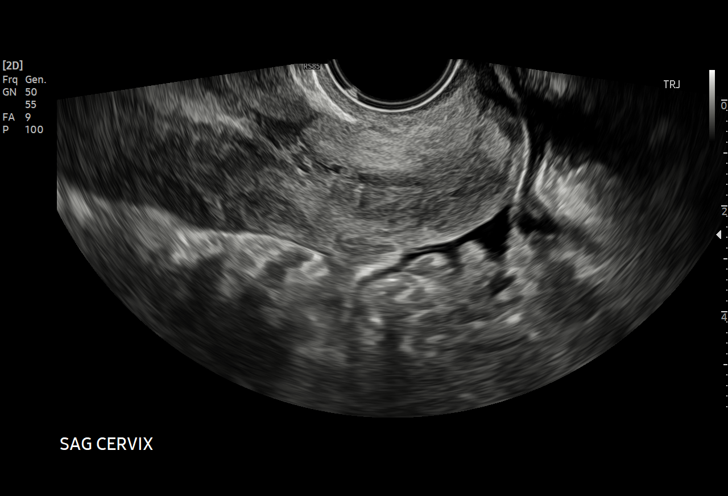
[im 61/112]
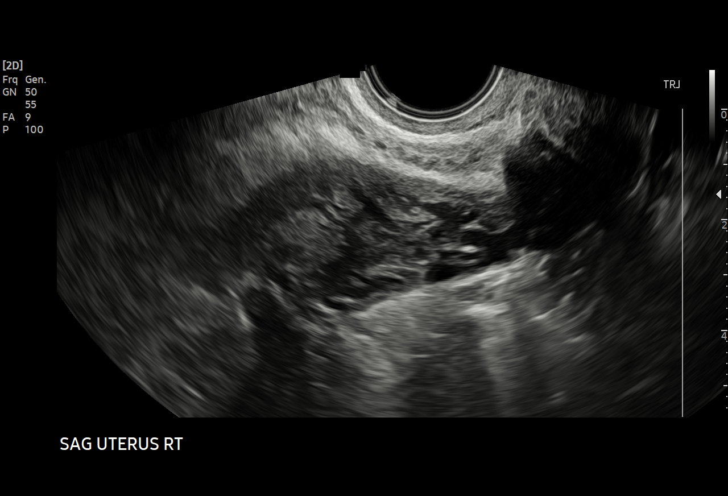
[im 70/112]
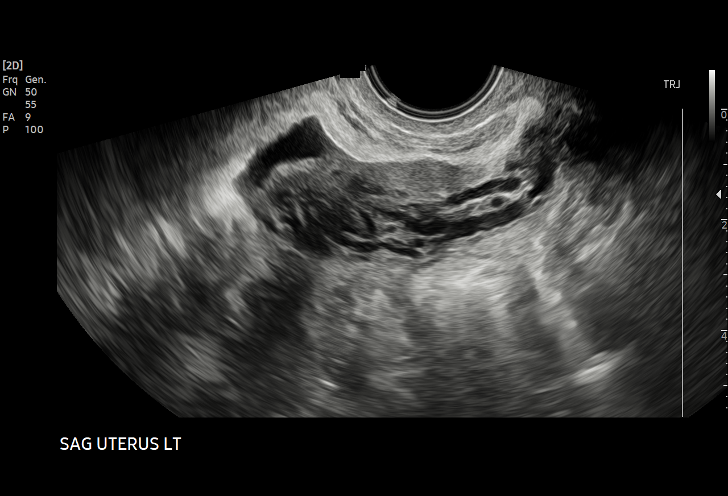
[im 75/112]
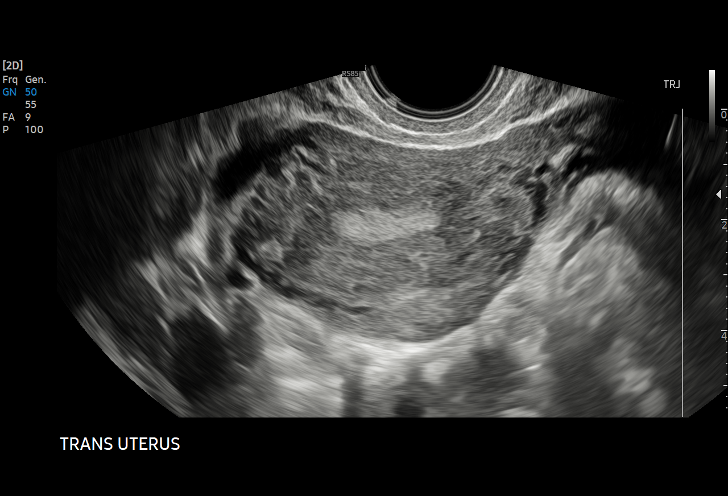
[im 84/112]
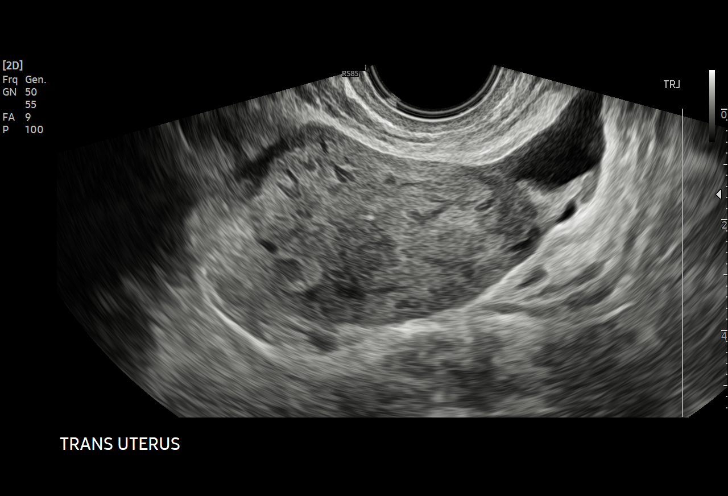
[im 93/112]
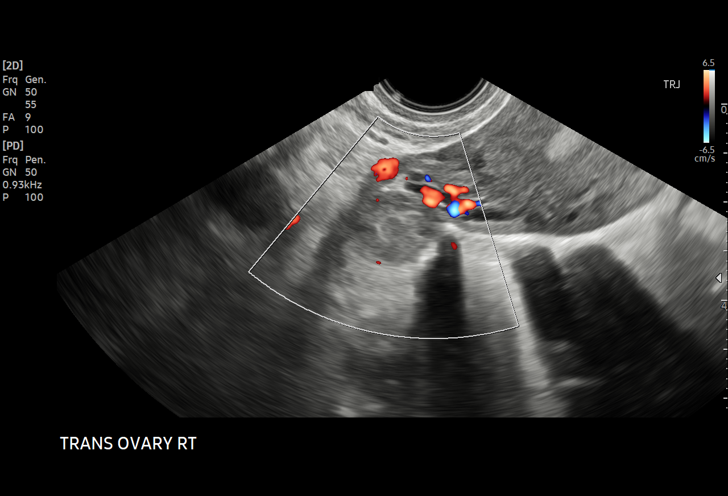
[im 102/112]
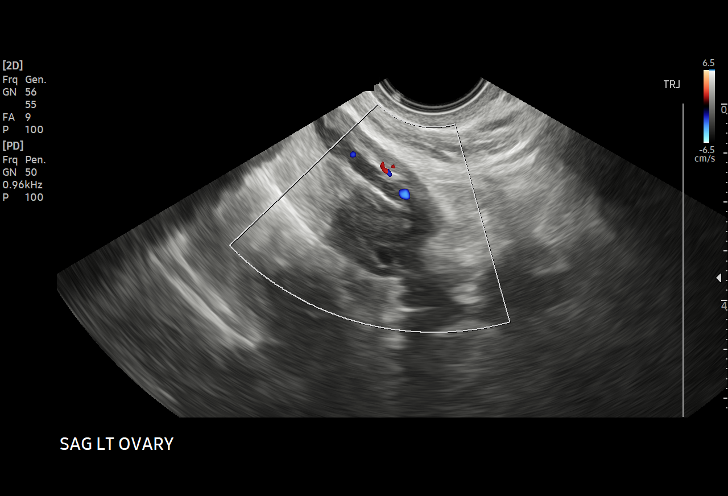
[im 112/112]
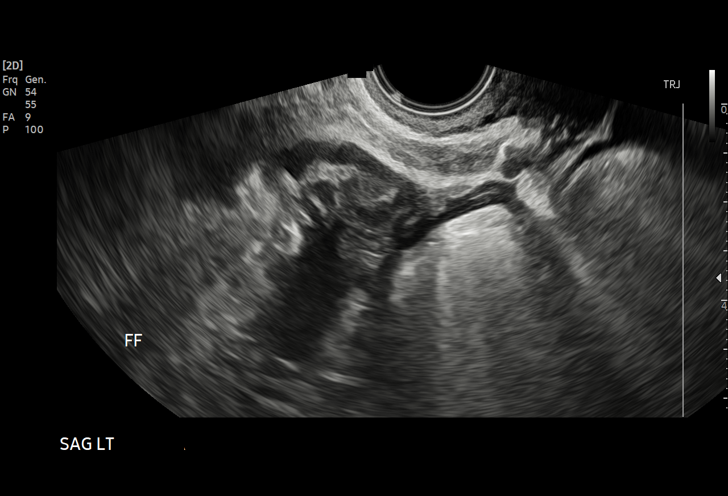

[14 of 25 positions shown; findings below may reference images not displayed]

FINDINGS: Uterus

Measurements: 10.5 x 5.1 x 6.2 cm = volume: 169.2 mL. Question
ill-defined rounded focus towards the uterine fundus with some
posterior shadowing measuring up to 1.3 cm in size which could
reflect a small fundal fibroid.

Endometrium

Thickness: 9.4 mm, non thickened.  No focal abnormality visualized.

Right ovary

Measurements: 3.8 x 1.8 x 1.9 cm = volume: 6.7 mL. 1.8 cm
heterogeneously echogenic focus in the superior right ovary with
some peripheral color flow may reflect a collapsing corpus
luteum/corpus albicans. No other focal adnexal abnormality.

Left ovary

Measurements: 3.2 x 1.0 x 1.5 cm = volume: 2.5 cm mL. Normal
follicles. No concerning abnormalities.

Other findings

Small amount of anechoic fluid in the posterior cul-de-sac and left
adnexa.
IMPRESSION: 1. 1.8 cm heterogeneously echogenic focus in the superior right
ovary may reflect a collapsing corpus luteum/corpus luteum albicans,
a typically benign physiologic finding warranting no routine
follow-up imaging in the absence of adnexal symptoms. Note: This
recommendation does not apply to premenarchal patients or to those
with increased risk (genetic, family history, elevated tumor markers
or other high-risk factors) of ovarian cancer. Reference: Radiology
[DATE]):[id number].
2. Small amount of anechoic fluid in the posterior cul-de-sac and
left adnexa is nonspecific and may be physiologic in this
reproductive age female.
3. Ill-defined, rounded 1.3 cm shadowing focus at the uterine
fundus, possible fundal fibroid.

## 2023-06-03 ENCOUNTER — Other Ambulatory Visit: Payer: Self-pay
